# Patient Record
Sex: Female | Born: 2000 | Race: White | Hispanic: No | Marital: Single | Smoking: Former smoker
Health system: Southern US, Community
[De-identification: ages and names within clinical notes are randomized; demographics above are authoritative.]

## PROBLEM LIST (undated history)

## (undated) ENCOUNTER — Inpatient Hospital Stay: Payer: Self-pay

## (undated) DIAGNOSIS — Z87440 Personal history of urinary (tract) infections: Secondary | ICD-10-CM

## (undated) DIAGNOSIS — Z789 Other specified health status: Secondary | ICD-10-CM

## (undated) HISTORY — DX: Personal history of urinary (tract) infections: Z87.440

## (undated) HISTORY — DX: Other specified health status: Z78.9

## (undated) HISTORY — PX: NO PAST SURGERIES: SHX2092

---
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
## 2018-08-11 NOTE — L&D Delivery Note (Signed)
Delivery Summary for Diana Estrada  Labor Events:   Preterm labor:   Rupture date: 07/01/2019  Rupture time: 4:06 AM  Rupture type: Spontaneous Possible ROM - for evaluation  Fluid Color:   Induction:   Augmentation:   Complications:   Cervical ripening:          Delivery:   Episiotomy:   Lacerations:   Repair suture:   Repair # of packets:   Blood loss (ml): 150   Information for the patient's newborn:  Arleen, Bar [[id number]]    Delivery 07/01/2019 10:24 PM by  Vaginal, Spontaneous Sex:  female Gestational Age: [redacted]w[redacted]d Delivery Clinician:   Living?:         APGARS  One minute Five minutes Ten minutes  Skin color:        Heart rate:        Grimace:        Muscle tone:        Breathing:        Totals: 8  9      Presentation/position:      Resuscitation:   Cord information:    Disposition of cord blood:     Blood gases sent?  Complications:   Placenta: Delivered:       appearance Newborn Measurements: Weight: 8 lb 14.5 oz (4040 g)  Height: 21.26"  Head circumference:    Chest circumference:    Other providers:    Additional  information: Forceps:   Vacuum:   Breech:   Observed anomalies        Delivery Note At 10:24 PM a viable and healthy child was delivered via Vaginal, Spontaneous (Presentation: Vertex; ROA position).  APGAR: 8, 9; weight 4040 grams.   Placenta status: spontaneously removed, intact.  Cord: 3-vessel with the following complications: nuchal cord x 1, reduced after delivery of infant.  Cord pH: not obtained  Anesthesia:  Epidural Episiotomy: None Lacerations: None Suture Repair: None Est. Blood Loss (mL): 150  Mom to postpartum.  Baby to Couplet care / Skin to Skin.  Rubie Maid, MD 07/01/2019, 10:46 PM

## 2018-12-16 ENCOUNTER — Ambulatory Visit (INDEPENDENT_AMBULATORY_CARE_PROVIDER_SITE_OTHER): Payer: Self-pay

## 2018-12-16 ENCOUNTER — Other Ambulatory Visit (HOSPITAL_COMMUNITY)
Admission: RE | Admit: 2018-12-16 | Discharge: 2018-12-16 | Disposition: A | Discharge status: Home / Self Care | Payer: Medicaid Other | Source: Ambulatory Visit | Attending: Obstetrics and Gynecology | Admitting: Obstetrics and Gynecology

## 2018-12-16 ENCOUNTER — Encounter: Payer: Self-pay | Admitting: Obstetrics and Gynecology

## 2018-12-16 ENCOUNTER — Ambulatory Visit (INDEPENDENT_AMBULATORY_CARE_PROVIDER_SITE_OTHER): Payer: Self-pay | Admitting: Obstetrics and Gynecology

## 2018-12-16 ENCOUNTER — Other Ambulatory Visit: Payer: Self-pay

## 2018-12-16 VITALS — BP 115/65 | HR 82 | Ht 67.0 in | Wt 215.9 lb

## 2018-12-16 DIAGNOSIS — Z3490 Encounter for supervision of normal pregnancy, unspecified, unspecified trimester: Secondary | ICD-10-CM | POA: Insufficient documentation

## 2018-12-16 DIAGNOSIS — O26891 Other specified pregnancy related conditions, first trimester: Secondary | ICD-10-CM

## 2018-12-16 DIAGNOSIS — N898 Other specified noninflammatory disorders of vagina: Secondary | ICD-10-CM | POA: Insufficient documentation

## 2018-12-16 DIAGNOSIS — Z3481 Encounter for supervision of other normal pregnancy, first trimester: Secondary | ICD-10-CM

## 2018-12-16 DIAGNOSIS — Z3A12 12 weeks gestation of pregnancy: Secondary | ICD-10-CM

## 2018-12-16 DIAGNOSIS — O3481 Maternal care for other abnormalities of pelvic organs, first trimester: Secondary | ICD-10-CM

## 2018-12-16 DIAGNOSIS — Z113 Encounter for screening for infections with a predominantly sexual mode of transmission: Secondary | ICD-10-CM | POA: Insufficient documentation

## 2018-12-16 DIAGNOSIS — R638 Other symptoms and signs concerning food and fluid intake: Secondary | ICD-10-CM | POA: Insufficient documentation

## 2018-12-16 DIAGNOSIS — R110 Nausea: Secondary | ICD-10-CM

## 2018-12-16 DIAGNOSIS — Z3A11 11 weeks gestation of pregnancy: Secondary | ICD-10-CM

## 2018-12-16 DIAGNOSIS — O99211 Obesity complicating pregnancy, first trimester: Secondary | ICD-10-CM

## 2018-12-16 DIAGNOSIS — O99210 Obesity complicating pregnancy, unspecified trimester: Secondary | ICD-10-CM

## 2018-12-16 DIAGNOSIS — Z0283 Encounter for blood-alcohol and blood-drug test: Secondary | ICD-10-CM

## 2018-12-16 DIAGNOSIS — N8312 Corpus luteum cyst of left ovary: Secondary | ICD-10-CM

## 2018-12-16 LAB — POCT URINALYSIS DIPSTICK OB
Bilirubin, UA: NEGATIVE
Blood, UA: NEGATIVE
Glucose, UA: NEGATIVE
Ketones, UA: NEGATIVE
Leukocytes, UA: NEGATIVE
Nitrite, UA: NEGATIVE
Spec Grav, UA: 1.025 (ref 1.010–1.025)
Urobilinogen, UA: 0.2 E.U./dL
pH, UA: 6 (ref 5.0–8.0)

## 2018-12-16 LAB — OB RESULTS CONSOLE VARICELLA ZOSTER ANTIBODY, IGG: Varicella: IMMUNE

## 2018-12-16 NOTE — Progress Notes (Signed)
New OB-Pt is present today for new ob physical. Pt stated that this is her second pregnancy, pt has a 3 year girl. Pt stated that she is having sharp pains and pressure in the lower abd pt think it maybe gas.

## 2018-12-16 NOTE — Progress Notes (Signed)
OBSTETRIC INITIAL PRENATAL VISIT  Subjective:    Diana Estrada is being seen today for her first obstetrical visit.  This is not a planned pregnancy. She is a 18 y.o. G2P1000 female at 324w5d gestation, Estimated Date of Delivery: 07/02/19 with Patient's last menstrual period was 09/25/2018 (however lmp unsure). [facility]. Her obstetrical history is significant for obesity. Relationship with FOB: significant other, living together (recently engaged). Patient does intend to breast feed. Pregnancy history fully reviewed.    OB History  Gravida Para Term Preterm AB Living  2 1 1  0 0 0  SAB TAB Ectopic Multiple Live Births  0 0 0 0 0    # Outcome Date GA Lbr Len/2nd Weight Sex Delivery Anes PTL Lv  2 Current           1 Term 06/29/15 3275w0d 30:50 / 01:23 6 lb 15.5 oz (3.16 kg) F Vag-Spont EPI       Name: Diana Estrada     Apgar1: 4  Apgar5: 9     Gynecologic History:  Last pap smear was: patient has never had a pap smear.  Denies history of STIs.  Contraception: None.  Last Depo Provera injection was in December.   Past Medical History:  Diagnosis Date  . History of recurrent UTIs 07/30/2016    Family History  Problem Relation Age of Onset  . Healthy Mother     Past Surgical History:  Procedure Laterality Date  . NO PAST SURGERIES      Social History   Socioeconomic History  . Marital status: Single    Spouse name: Not on file  . Number of children: Not on file  . Years of education: Not on file  . Highest education level: Not on file  Occupational History  . Not on file  Social Needs  . Financial resource strain: Not on file  . Food insecurity:    Worry: Not on file    Inability: Not on file  . Transportation needs:    Medical: Not on file    Non-medical: Not on file  Tobacco Use  . Smoking status: Former Smoker    Last attempt to quit: 08/11/2014    Years since quitting: 4.3  . Smokeless tobacco:  Never Used  Substance and Sexual Activity  . Alcohol use: No  . Drug use: Not Currently    Types: Marijuana  . Sexual activity: Yes  Lifestyle  . Physical activity:    Days per week: Not on file    Minutes per session: Not on file  . Stress: Not on file  Relationships  . Social connections:    Talks on phone: Not on file    Gets together: Not on file    Attends religious service: Not on file    Active member of club or organization: Not on file    Attends meetings of clubs or organizations: Not on file    Relationship status: Not on file  . Intimate partner violence:    Fear of current or ex partner: Not on file    Emotionally abused: Not on file    Physically abused: Not on file    Forced sexual activity: Not on file  Other Topics Concern  . Not on file  Social History Narrative  . Not on file    Current Outpatient Medications on File Prior to Visit  Medication Sig Dispense Refill  . Prenatal Vit-Fe Fumarate-FA (PRENATAL MULTIVITAMIN) TABS  tablet Take 1 tablet by mouth daily at 12 noon.     No current facility-administered medications on file prior to visit.     No Known Allergies   Review of Systems General: Not Present- Fever, Weight Loss and Weight Gain. Skin: Not Present- Rash. HEENT: Not Present- Blurred Vision, Headache and Bleeding Gums. Respiratory: Not Present- Difficulty Breathing. Breast: Not Present- Breast Mass. Cardiovascular: Not Present- Chest Pain, Elevated Blood Pressure, Fainting / Blacking Out and Shortness of Breath. Gastrointestinal: Not Present- Abdominal Pain, Constipation. Present - Nausea without Vomiting. Female Genitourinary: Not Present- Frequency, Painful Urination, Pelvic Pain, Vaginal Bleeding, Vaginal Discharge, Contractions, regular, Fetal Movements Decreased, Urinary Complaints and Vaginal Fluid. Positive for vaginal odor x 1 week.  Musculoskeletal: Not Present- Back Pain and Leg Cramps. Neurological: Not Present- Dizziness.  Psychiatric: Not Present- Depression.     Objective:   Blood pressure 115/65, pulse 82, weight 215 lb 14.4 oz (97.9 kg), last menstrual period 09/25/2018.  Body mass index is 33.81 kg/m.   General Appearance:    Alert, cooperative, no distress, appears stated age  Head:    Normocephalic, without obvious abnormality, atraumatic  Eyes:    PERRL, conjunctiva/corneas clear, EOM's intact, both eyes  Ears:    Normal external ear canals, both ears  Nose:   Nares normal, septum midline, mucosa normal, no drainage or sinus tenderness  Throat:   Lips, mucosa, and tongue normal; teeth and gums normal  Neck:   Supple, symmetrical, trachea midline, no adenopathy; thyroid: no enlargement/tenderness/nodules; no carotid bruit or JVD  Back:     Symmetric, no curvature, ROM normal, no CVA tenderness  Lungs:     Clear to auscultation bilaterally, respirations unlabored  Chest Wall:    No tenderness or deformity   Heart:    Regular rate and rhythm, S1 and S2 normal, no murmur, rub or gallop  Breast Exam:    No tenderness, masses, or nipple abnormality  Abdomen:     Soft, non-tender, bowel sounds active all four quadrants, no masses, no organomegaly.  FH 12.  FHT 156 bpm.  Genitalia:    Pelvic:external genitalia normal, vagina without lesions, discharge, or tenderness, rectovaginal septum  normal. Cervix normal in appearance, no cervical motion tenderness, no adnexal masses or tenderness.  Pregnancy positive findings: uterine enlargement: 12 wk size, nontender.   Rectal:    Normal external sphincter.  No hemorrhoids appreciated. Internal exam not done.   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 Prenatal vitamins encouraged. Problem list reviewed and updated. New OB counseling:  The patient has been given an overview regarding routine prenatal care.  Recommendations regarding diet, weight gain, and exercise in pregnancy were given. Prenatal testing, optional genetic testing, and ultrasound use in pregnancy were reviewed.  Genetic testing: declined. Benefits of Breast Feeding were discussed. The patient is encouraged to consider nursing her baby post partum. Given samples of Bonjesta for nausea. Patient also encouraged on use of Vitamin B6, dry diet.  Nuswab performed for vaginal odor.  Follow up in 4-6 weeks.  The patient has Medicaid.  [place] Medicaid Risk Screening Form completed today.    50% of 30 min visit spent on counseling and coordination of care.      Hildred Laser, MD [facility]

## 2018-12-17 LAB — HEPATITIS B SURFACE ANTIGEN: Hepatitis B Surface Ag: NEGATIVE

## 2018-12-17 LAB — NICOTINE SCREEN, URINE: Cotinine Ql Scrn, Ur: POSITIVE ng/mL — AB

## 2018-12-17 LAB — HGB SOLU + RFLX FRAC: Sickle Solubility Test - HGBRFX: NEGATIVE

## 2018-12-17 LAB — URINALYSIS, ROUTINE W REFLEX MICROSCOPIC
Bilirubin, UA: NEGATIVE
Glucose, UA: NEGATIVE
Ketones, UA: NEGATIVE
Leukocytes,UA: NEGATIVE
Nitrite, UA: NEGATIVE
RBC, UA: NEGATIVE
Specific Gravity, UA: >=1.03 — AB (ref 1.005–1.030)
Urobilinogen, Ur: 0.2 mg/dL (ref 0.2–1.0)
pH, UA: 6 (ref 5.0–7.5)

## 2018-12-17 LAB — RUBELLA SCREEN: Rubella Antibodies, IGG: 1.41 index (ref >0.99)

## 2018-12-17 LAB — DRUG PROFILE, UR, 9 DRUGS (LABCORP)
Amphetamines, Urine: NEGATIVE ng/mL
Barbiturate Quant, Ur: NEGATIVE ng/mL
Benzodiazepine Quant, Ur: NEGATIVE ng/mL
Cannabinoid Quant, Ur: NEGATIVE ng/mL
Cocaine (Metab.): NEGATIVE ng/mL
Methadone Screen, Urine: NEGATIVE ng/mL
Opiate Quant, Ur: NEGATIVE ng/mL
PCP Quant, Ur: NEGATIVE ng/mL
Propoxyphene: NEGATIVE ng/mL

## 2018-12-17 LAB — HIV ANTIBODY (ROUTINE TESTING W REFLEX): HIV Screen 4th Generation wRfx: NONREACTIVE

## 2018-12-17 LAB — RPR: RPR Ser Ql: NONREACTIVE

## 2018-12-17 LAB — ABO AND RH: Rh Factor: POSITIVE

## 2018-12-17 LAB — TSH: TSH: 1.88 u[IU]/mL (ref 0.450–4.500)

## 2018-12-17 LAB — HEMOGLOBIN A1C
Est. average glucose Bld gHb Est-mCnc: 97 mg/dL
Hgb A1c MFr Bld: 5 % (ref 4.8–5.6)

## 2018-12-17 LAB — ANTIBODY SCREEN: Antibody Screen: NEGATIVE

## 2018-12-17 LAB — VARICELLA ZOSTER ANTIBODY, IGG: Varicella zoster IgG: 604 index (ref >165)

## 2018-12-18 NOTE — Patient Instructions (Signed)
First Trimester of Pregnancy  The first trimester of pregnancy is from week 1 until the end of week 13 (months 1 through 3). During this time, your baby will begin to develop inside you. At 6-8 weeks, the eyes and face are formed, and the heartbeat can be seen on ultrasound. At the end of 12 weeks, all the baby's organs are formed. Prenatal care is all the medical care you receive before the birth of your baby. Make sure you get good prenatal care and follow all of your doctor's instructions. Follow these instructions at home: Medicines  Take over-the-counter and prescription medicines only as told by your doctor. Some medicines are safe and some medicines are not safe during pregnancy.  Take a prenatal vitamin that contains at least 600 micrograms (mcg) of folic acid.  If you have trouble pooping (constipation), take medicine that will make your stool soft (stool softener) if your doctor approves. Eating and drinking   Eat regular, healthy meals.  Your doctor will tell you the amount of weight gain that is right for you.  Avoid raw meat and uncooked cheese.  If you feel sick to your stomach (nauseous) or throw up (vomit): ? Eat 4 or 5 small meals a day instead of 3 large meals. ? Try eating a few soda crackers. ? Drink liquids between meals instead of during meals.  To prevent constipation: ? Eat foods that are high in fiber, like fresh fruits and vegetables, whole grains, and beans. ? Drink enough fluids to keep your pee (urine) clear or pale yellow. Activity  Exercise only as told by your doctor. Stop exercising if you have cramps or pain in your lower belly (abdomen) or low back.  Do not exercise if it is too hot, too humid, or if you are in a place of great height (high altitude).  Try to avoid standing for long periods of time. Move your legs often if you must stand in one place for a long time.  Avoid heavy lifting.  Wear low-heeled shoes. Sit and stand up straight.   You can have sex unless your doctor tells you not to. Relieving pain and discomfort  Wear a good support bra if your breasts are sore.  Take warm water baths (sitz baths) to soothe pain or discomfort caused by hemorrhoids. Use hemorrhoid cream if your doctor says it is okay.  Rest with your legs raised if you have leg cramps or low back pain.  If you have puffy, bulging veins (varicose veins) in your legs: ? Wear support hose or compression stockings as told by your doctor. ? Raise (elevate) your feet for 15 minutes, 3-4 times a day. ? Limit salt in your food. Prenatal care  Schedule your prenatal visits by the twelfth week of pregnancy.  Write down your questions. Take them to your prenatal visits.  Keep all your prenatal visits as told by your doctor. This is important. Safety  Wear your seat belt at all times when driving.  Make a list of emergency phone numbers. The list should include numbers for family, friends, the hospital, and police and fire departments. General instructions  Ask your doctor for a referral to a local prenatal class. Begin classes no later than at the start of month 6 of your pregnancy.  Ask for help if you need counseling or if you need help with nutrition. Your doctor can give you advice or tell you where to go for help.  Do not use hot tubs, steam   rooms, or saunas.  Do not douche or use tampons or scented sanitary pads.  Do not cross your legs for long periods of time.  Avoid all herbs and alcohol. Avoid drugs that are not approved by your doctor.  Do not use any tobacco products, including cigarettes, chewing tobacco, and electronic cigarettes. If you need help quitting, ask your doctor. You may get counseling or other support to help you quit.  Avoid cat litter boxes and soil used by cats. These carry germs that can cause birth defects in the baby and can cause a loss of your baby (miscarriage) or stillbirth.  Visit your dentist. At home,  brush your teeth with a soft toothbrush. Be gentle when you floss. Contact a doctor if:  You are dizzy.  You have mild cramps or pressure in your lower belly.  You have a nagging pain in your belly area.  You continue to feel sick to your stomach, you throw up, or you have watery poop (diarrhea).  You have a bad smelling fluid coming from your vagina.  You have pain when you pee (urinate).  You have increased puffiness (swelling) in your face, hands, legs, or ankles. Get help right away if:  You have a fever.  You are leaking fluid from your vagina.  You have spotting or bleeding from your vagina.  You have very bad belly cramping or pain.  You gain or lose weight rapidly.  You throw up blood. It may look like coffee grounds.  You are around people who have German measles, fifth disease, or chickenpox.  You have a very bad headache.  You have shortness of breath.  You have any kind of trauma, such as from a fall or a car accident. Summary  The first trimester of pregnancy is from week 1 until the end of week 13 (months 1 through 3).  To take care of yourself and your unborn baby, you will need to eat healthy meals, take medicines only if your doctor tells you to do so, and do activities that are safe for you and your baby.  Keep all follow-up visits as told by your doctor. This is important as your doctor will have to ensure that your baby is healthy and growing well. This information is not intended to replace advice given to you by your health care provider. Make sure you discuss any questions you have with your health care provider. Document Released: 01/14/2008 Document Revised: 08/05/2016 Document Reviewed: 08/05/2016 Elsevier Interactive Patient Education  2019 Elsevier Inc.  

## 2018-12-19 LAB — CULTURE, OB URINE

## 2018-12-19 LAB — URINE CULTURE, OB REFLEX

## 2018-12-20 LAB — CERVICOVAGINAL ANCILLARY ONLY
Bacterial vaginitis: POSITIVE — AB
Candida vaginitis: NEGATIVE
Chlamydia: NEGATIVE
Neisseria Gonorrhea: NEGATIVE
Trichomonas: NEGATIVE

## 2018-12-30 ENCOUNTER — Other Ambulatory Visit: Payer: Self-pay

## 2018-12-30 MED ORDER — METRONIDAZOLE 500 MG PO TABS: 500.0000 mg | ORAL_TABLET | Freq: Two times a day (BID) | ORAL | 0 refills | Status: DC

## 2019-01-26 ENCOUNTER — Telehealth: Payer: Self-pay

## 2019-01-26 NOTE — Telephone Encounter (Signed)
Pt prescreened no symptoms pt has face mask.   Coronavirus (COVID-19) Are you at risk?  Are you at risk for the Coronavirus (COVID-19)?  To be considered HIGH RISK for Coronavirus (COVID-19), you have to meet the following criteria:  . Traveled to [place], [place], [place], [place] or [place]; or in the [place] to [place], [place], [place], or [place]; and have fever, cough, and shortness of breath within the last 2 weeks of travel OR . Been in close contact with a person diagnosed with COVID-19 within the last 2 weeks and have fever, cough, and shortness of breath . IF YOU DO NOT MEET THESE CRITERIA, YOU ARE CONSIDERED LOW RISK FOR COVID-19.  What to do if you are HIGH RISK for COVID-19?  . If you are having a medical emergency, call 911. . Seek medical care right away. Before you go to a doctor's office, urgent care or emergency department, call ahead and tell them about your recent travel, contact with someone diagnosed with COVID-19, and your symptoms. You should receive instructions from your physician's office regarding next steps of care.  . When you arrive at healthcare provider, tell the healthcare staff immediately you have returned from visiting [place], [place], [place], [place] or [place]; or traveled in the [place] to [place], [place], [place], or [place]; in the last two weeks or you have been in close contact with a person diagnosed with COVID-19 in the last 2 weeks.   . Tell the health care staff about your symptoms: fever, cough and shortness of breath. . After you have been seen by a medical provider, you will be either: o Tested for (COVID-19) and discharged home on quarantine except to seek medical care if symptoms worsen, and asked to  - Stay home and avoid contact with others until you get your results (4-5 days)  - Avoid travel on public transportation if possible (such as bus, train, or airplane) or o Sent to the Emergency Department by EMS for  evaluation, COVID-19 testing, and possible admission depending on your condition and test results.  What to do if you are LOW RISK for COVID-19?  Reduce your risk of any infection by using the same precautions used for avoiding the common cold or flu:  . Wash your hands often with soap and warm water for at least 20 seconds.  If soap and water are not readily available, use an alcohol-based hand sanitizer with at least 60% alcohol.  . If coughing or sneezing, cover your mouth and nose by coughing or sneezing into the elbow areas of your shirt or coat, into a tissue or into your sleeve (not your hands). . Avoid shaking hands with others and consider head nods or verbal greetings only. . Avoid touching your eyes, nose, or mouth with unwashed hands.  . Avoid close contact with people who are sick. . Avoid places or events with large numbers of people in one location, like concerts or sporting events. . Carefully consider travel plans you have or are making. . If you are planning any travel outside or inside the [place], visit the CDC's Travelers' Health webpage for the latest health notices. . If you have some symptoms but not all symptoms, continue to monitor at home and seek medical attention if your symptoms worsen. . If you are having a medical emergency, call 911.   ADDITIONAL HEALTHCARE OPTIONS FOR PATIENTS  [facility] / e-Visit: [email or web address]           [facility]: [phone]  [facility]: [phone]                   [facility]: [phone]  

## 2019-01-27 ENCOUNTER — Ambulatory Visit (INDEPENDENT_AMBULATORY_CARE_PROVIDER_SITE_OTHER): Payer: Medicaid Other | Admitting: Obstetrics and Gynecology

## 2019-01-27 ENCOUNTER — Encounter: Payer: Self-pay | Admitting: Obstetrics and Gynecology

## 2019-01-27 ENCOUNTER — Other Ambulatory Visit: Payer: Self-pay

## 2019-01-27 VITALS — BP 109/71 | HR 102 | Ht 67.0 in | Wt 221.2 lb

## 2019-01-27 DIAGNOSIS — Z3482 Encounter for supervision of other normal pregnancy, second trimester: Secondary | ICD-10-CM

## 2019-01-27 NOTE — Progress Notes (Signed)
ROB: Without complaint.  Desires AFP and maternity 21 today.  FAS next visit

## 2019-01-27 NOTE — Addendum Note (Signed)
Addended by: Durwin Glaze on: 01/27/2019 10:31 AM   Modules accepted: Orders

## 2019-01-27 NOTE — Progress Notes (Signed)
Patient comes in today for ROB> no concerns.

## 2019-01-30 LAB — AFP, SERUM, OPEN SPINA BIFIDA
AFP MoM: 1.12
AFP Value: 33 ng/mL
Gest. Age on Collection Date: 17 weeks
Maternal Age At EDD: 20.5 yr
OSBR Risk 1 IN: 8371
Test Results:: NEGATIVE
Weight: 221 [lb_av]

## 2019-02-03 ENCOUNTER — Telehealth: Payer: Self-pay | Admitting: Obstetrics and Gynecology

## 2019-02-03 LAB — MATERNIT21  PLUS CORE+ESS+SCA, BLOOD

## 2019-02-03 NOTE — Telephone Encounter (Signed)
Pt called no answer LM via voicemail to call the office to speak more about her concerns for calling the office.

## 2019-02-03 NOTE — Telephone Encounter (Signed)
The pt is requesting a call back for assistance with navigating her MyChart/finding gender results. Please advise.

## 2019-02-04 NOTE — Telephone Encounter (Signed)
JW spoke with pt and assisted her with finding the information that she was looking for.

## 2019-02-23 ENCOUNTER — Telehealth: Payer: Self-pay

## 2019-02-23 NOTE — Telephone Encounter (Signed)
Pt called no answer LMTRC for prescreening. 

## 2019-02-24 ENCOUNTER — Ambulatory Visit (INDEPENDENT_AMBULATORY_CARE_PROVIDER_SITE_OTHER): Payer: Self-pay

## 2019-02-24 ENCOUNTER — Encounter: Payer: Self-pay | Admitting: Obstetrics and Gynecology

## 2019-02-24 ENCOUNTER — Other Ambulatory Visit: Payer: Self-pay

## 2019-02-24 ENCOUNTER — Ambulatory Visit (INDEPENDENT_AMBULATORY_CARE_PROVIDER_SITE_OTHER): Payer: Self-pay | Admitting: Obstetrics and Gynecology

## 2019-02-24 VITALS — BP 115/73 | HR 77 | Wt 224.5 lb

## 2019-02-24 DIAGNOSIS — Z3482 Encounter for supervision of other normal pregnancy, second trimester: Secondary | ICD-10-CM

## 2019-02-24 DIAGNOSIS — Z363 Encounter for antenatal screening for malformations: Secondary | ICD-10-CM

## 2019-02-24 LAB — POCT URINALYSIS DIPSTICK OB
Blood, UA: NEGATIVE
Glucose, UA: NEGATIVE
Ketones, UA: NEGATIVE
Leukocytes, UA: NEGATIVE
Nitrite, UA: NEGATIVE
POC,PROTEIN,UA: NEGATIVE
Spec Grav, UA: 1.015 (ref 1.010–1.025)
Urobilinogen, UA: 0.2 E.U./dL
pH, UA: 6.5 (ref 5.0–8.0)

## 2019-02-24 NOTE — Progress Notes (Signed)
ROB: Patient complains of leg cramps, mostly at night.  Advised on increasing hydration, calcium/potassium intake, tablespoon of mustard, or warm milk before bed. Normal anatomy scan today. Debating on options for contraception. RTC in 4 weeks.

## 2019-02-24 NOTE — Progress Notes (Signed)
Subjective:    Diana Estrada is a 18 y.o. female being seen today for her obstetrical visit. She is at [redacted]w[redacted]d gestation. She has one female child, and she is pleased that her current pregnancy is female. Doesn't plan on having additional children. Her first birth was vaginal with epidural; she's hoping to have a natural birth this time, but is also considering epidural. She used formula with her first child and didn't try formula, but she is still thinking about what she wants to do this time. Plans for birth control after this pregnancy-- had Depo before, didn't like ADRs. Mirena uncomfortable during sex. Is open to OCP but is worried about forgetting to take. Works at [employer] in [employer], so minimal contact with people/covid. She has been having leg cramping at night, but no varicose veins.   Menstrual History: OB History    Gravida  2   Para  1   Term  1   Preterm      AB      Living  1     SAB      TAB      Ectopic      Multiple  0   Live Births  1           Patient's last menstrual period was 09/25/2018 (lmp unknown).      Review of Systems See HPI.   Objective:    LMP 09/25/2018 (LMP Unknown)  FHT: 138bpm  Uterine Size: Not measured     Musculoskeletal- no edema, no varicose veins  Assessment & Plan    [redacted]w[redacted]d gestation. US OB performed today (02/24/19)- [redacted]w[redacted]d Viable Singleton Intrauterine pregnancy, female.  Birth control counseling Pt has tried Depo and Mirena in the past. She would like to try a different method in the future. She will continue to think about options and let us know.   Leg cramps at night Rec increasing potassium and calcium. Discussed potassium and calcium rich foods. Rec trying Tums at night as needed, eating a spoonful of mustard, good hydration, etc. On exam, no evidence of varicose veins; she can try compression socks if she does experience this.    Follow up in 4 weeks or as needed.  Marin Roberts, PA-S 02/24/19

## 2019-02-24 NOTE — Progress Notes (Signed)
ROB-Pt is present for prenatal care and anatomy scan. Pt stated having leg cramps at night.

## 2019-03-24 ENCOUNTER — Encounter: Payer: Self-pay | Admitting: Obstetrics and Gynecology

## 2019-03-24 ENCOUNTER — Other Ambulatory Visit: Payer: Self-pay

## 2019-03-24 ENCOUNTER — Ambulatory Visit (INDEPENDENT_AMBULATORY_CARE_PROVIDER_SITE_OTHER): Payer: Medicaid Other | Admitting: Obstetrics and Gynecology

## 2019-03-24 VITALS — BP 107/68 | HR 77 | Ht 67.0 in | Wt 234.0 lb

## 2019-03-24 DIAGNOSIS — Z3482 Encounter for supervision of other normal pregnancy, second trimester: Secondary | ICD-10-CM

## 2019-03-24 LAB — POCT URINALYSIS DIPSTICK OB
Bilirubin, UA: NEGATIVE
Blood, UA: NEGATIVE
Glucose, UA: NEGATIVE
Ketones, UA: NEGATIVE
Leukocytes, UA: NEGATIVE
Nitrite, UA: NEGATIVE
POC,PROTEIN,UA: NEGATIVE
Spec Grav, UA: 1.01 (ref 1.010–1.025)
Urobilinogen, UA: 0.2 E.U./dL
pH, UA: 7 (ref 5.0–8.0)

## 2019-03-24 NOTE — Progress Notes (Signed)
ROB: Patient has no complaints.  Reports daily fetal movement.  Taking prenatal vitamins.  1 hour GCT next visit.

## 2019-03-24 NOTE — Addendum Note (Signed)
Addended by: Durwin Glaze on: 03/24/2019 01:43 PM   Modules accepted: Orders

## 2019-03-24 NOTE — Progress Notes (Signed)
Patient comes in today for ROB visit. She has no concerns today.  

## 2019-04-14 ENCOUNTER — Ambulatory Visit (INDEPENDENT_AMBULATORY_CARE_PROVIDER_SITE_OTHER): Payer: Medicaid Other | Admitting: Obstetrics and Gynecology

## 2019-04-14 ENCOUNTER — Other Ambulatory Visit: Payer: Medicaid Other

## 2019-04-14 ENCOUNTER — Encounter: Payer: Self-pay | Admitting: Obstetrics and Gynecology

## 2019-04-14 ENCOUNTER — Other Ambulatory Visit: Payer: Self-pay | Admitting: Surgical

## 2019-04-14 ENCOUNTER — Other Ambulatory Visit: Payer: Self-pay

## 2019-04-14 VITALS — BP 108/71 | HR 78 | Ht 67.0 in | Wt 235.7 lb

## 2019-04-14 DIAGNOSIS — Z3482 Encounter for supervision of other normal pregnancy, second trimester: Secondary | ICD-10-CM

## 2019-04-14 DIAGNOSIS — Z23 Encounter for immunization: Secondary | ICD-10-CM

## 2019-04-14 LAB — POCT URINALYSIS DIPSTICK OB
Bilirubin, UA: NEGATIVE
Blood, UA: NEGATIVE
Glucose, UA: NEGATIVE
Ketones, UA: NEGATIVE
Leukocytes, UA: NEGATIVE
Nitrite, UA: NEGATIVE
POC,PROTEIN,UA: NEGATIVE
Spec Grav, UA: 1.01 (ref 1.010–1.025)
Urobilinogen, UA: 0.2 E.U./dL
pH, UA: 7 (ref 5.0–8.0)

## 2019-04-14 NOTE — Addendum Note (Signed)
Addended by: Durwin Glaze on: 04/14/2019 01:49 PM   Modules accepted: Orders

## 2019-04-14 NOTE — Progress Notes (Signed)
Patient comes in today for ROB visit. No concerns.  

## 2019-04-14 NOTE — Progress Notes (Signed)
ROB:  No problems.  1hr GCT today.

## 2019-04-15 LAB — CBC
Hematocrit: 34.6 % (ref 34.0–46.6)
Hemoglobin: 11 g/dL — ABNORMAL LOW (ref 11.1–15.9)
MCH: 30.3 pg (ref 26.6–33.0)
MCHC: 31.8 g/dL (ref 31.5–35.7)
MCV: 95 fL (ref 79–97)
Platelets: 182 10*3/uL (ref 150–450)
RBC: 3.63 x10E6/uL — ABNORMAL LOW (ref 3.77–5.28)
RDW: 12.9 % (ref 11.7–15.4)
WBC: 7.8 10*3/uL (ref 3.4–10.8)

## 2019-04-15 LAB — GLUCOSE, 1 HOUR GESTATIONAL: Gestational Diabetes Screen: 124 mg/dL (ref 65–139)

## 2019-04-15 LAB — RPR: RPR Ser Ql: NONREACTIVE

## 2019-05-13 ENCOUNTER — Encounter: Payer: Self-pay | Admitting: Obstetrics and Gynecology

## 2019-05-13 ENCOUNTER — Other Ambulatory Visit: Payer: Self-pay

## 2019-05-13 ENCOUNTER — Ambulatory Visit (INDEPENDENT_AMBULATORY_CARE_PROVIDER_SITE_OTHER): Payer: Medicaid Other | Admitting: Obstetrics and Gynecology

## 2019-05-13 VITALS — BP 113/70 | HR 93 | Ht 67.0 in | Wt 243.4 lb

## 2019-05-13 DIAGNOSIS — Z3A32 32 weeks gestation of pregnancy: Secondary | ICD-10-CM

## 2019-05-13 DIAGNOSIS — Z3482 Encounter for supervision of other normal pregnancy, second trimester: Secondary | ICD-10-CM

## 2019-05-13 DIAGNOSIS — R12 Heartburn: Secondary | ICD-10-CM

## 2019-05-13 DIAGNOSIS — O2603 Excessive weight gain in pregnancy, third trimester: Secondary | ICD-10-CM

## 2019-05-13 DIAGNOSIS — R109 Unspecified abdominal pain: Secondary | ICD-10-CM

## 2019-05-13 DIAGNOSIS — O26893 Other specified pregnancy related conditions, third trimester: Secondary | ICD-10-CM

## 2019-05-13 HISTORY — DX: Excessive weight gain in pregnancy, third trimester: O26.03

## 2019-05-13 LAB — POCT URINALYSIS DIPSTICK OB
Bilirubin, UA: NEGATIVE
Blood, UA: NEGATIVE
Glucose, UA: NEGATIVE
Ketones, UA: NEGATIVE
Leukocytes, UA: NEGATIVE
Nitrite, UA: NEGATIVE
Spec Grav, UA: 1.01 (ref 1.010–1.025)
Urobilinogen, UA: 0.2 E.U./dL
pH, UA: 7 (ref 5.0–8.0)

## 2019-05-13 MED ORDER — PANTOPRAZOLE SODIUM 20 MG PO TBEC: 20.0000 mg | DELAYED_RELEASE_TABLET | Freq: Every day | ORAL | 2 refills | Status: DC

## 2019-05-13 NOTE — Patient Instructions (Addendum)
Abdominal Pain During Pregnancy  Abdominal pain is common during pregnancy, and has many possible causes. Some causes are more serious than others, and sometimes the cause is not known. Abdominal pain can be a sign that labor is starting. It can also be caused by normal growth and stretching of muscles and ligaments during pregnancy. Always tell your health care provider if you have any abdominal pain. Follow these instructions at home:  Do not have sex or put anything in your vagina until your pain goes away completely.  Get plenty of rest until your pain improves.  Drink enough fluid to keep your urine pale yellow.  Take over-the-counter and prescription medicines only as told by your health care provider.  Keep all follow-up visits as told by your health care provider. This is important. Contact a health care provider if:  Your pain continues or gets worse after resting.  You have lower abdominal pain that: ? Comes and goes at regular intervals. ? Spreads to your back. ? Is similar to menstrual cramps.  You have pain or burning when you urinate. Get help right away if:  You have a fever or chills.  You have vaginal bleeding.  You are leaking fluid from your vagina.  You are passing tissue from your vagina.  You have vomiting or diarrhea that lasts for more than 24 hours.  Your baby is moving less than usual.  You feel very weak or faint.  You have shortness of breath.  You develop severe pain in your upper abdomen. Summary  Abdominal pain is common during pregnancy, and has many possible causes.  If you experience abdominal pain during pregnancy, tell your health care provider right away.  Follow your health care provider's home care instructions and keep all follow-up visits as directed. This information is not intended to replace advice given to you by your health care provider. Make sure you discuss any questions you have with your health care provider.  Document Released: 07/28/2005 Document Revised: 11/15/2018 Document Reviewed: 10/30/2016 Elsevier Patient Education  2020 Elsevier Inc.     Healthy Edison InternationalWeight Gain During Pregnancy, Adult A certain amount of weight gain during pregnancy is normal and healthy. How much weight you should gain depends on your overall health and a measurement called BMI (body mass index). BMI is an estimate of your body fat based on your height and weight. You can use an Software engineeronline calculator to figure out your BMI, or you can ask your health care provider to calculate it for you at your next visit. Your recommended pregnancy weight gain is based on your pre-pregnancy BMI. General guidelines for a healthy total weight gain during pregnancy are listed below. If your BMI at or before the start of your pregnancy is:  Less than 18.5 (underweight), you should gain 28-40 lb (13-18 kg).  18.5-24.9 (normal weight), you should gain 25-35 lb (11-16 kg).  25-29.9 (overweight), you should gain 15-25 lb (7-11 kg).  30 or higher (obese), you should gain 11-20 lb (5-9 kg). These ranges vary depending on your individual health. If you are carrying more than one baby (multiples), it may be safe to gain more weight than these recommendations. If you gain less weight than recommended, that may be safe as long as your baby is growing and developing normally. How can unhealthy weight gain affect me and my baby? Gaining too much weight during pregnancy can lead to pregnancy complications, such as:  A temporary form of diabetes that develops during pregnancy (gestational  diabetes).  High blood pressure during pregnancy and protein in your urine (preeclampsia).  High blood pressure during pregnancy without protein in your urine (gestational hypertension).  Your baby having a high weight at birth, which may: ? Raise your risk of having a more difficult delivery or a surgical delivery (cesarean delivery, or C-section). ? Raise your child's  risk of developing obesity during childhood. Not gaining enough weight can be life-threatening for your baby, and it may raise your baby's chances of:  Being born early (preterm).  Growing more slowly than normal during pregnancy (growth restriction).  Having a low weight at birth. What actions can I take to gain a healthy amount of weight during pregnancy? General instructions  Keep track of your weight gain during pregnancy.  Take over-the-counter and prescription medicines only as told by your health care provider. Take all prenatal supplements as directed.  Keep all health care visits during pregnancy (prenatal visits). These visits are a good time to discuss your weight gain. Your health care provider will weigh you at each visit to make sure you are gaining a healthy amount of weight. Nutrition   Eat a balanced, nutrient-rich diet. Eat plenty of: ? Fruits and vegetables, such as berries and broccoli. ? Whole grains, such as millet, barley, whole-wheat breads and cereals, and oatmeal. ? Low-fat dairy products or non-dairy products such as almond milk or rice milk. ? Protein foods, such as lean meat, chicken, eggs, and legumes (such as peas, beans, soybeans, and lentils).  Avoid foods that are fried or have a lot of fat, salt (sodium), or sugar.  Drink enough fluid to keep your urine pale yellow.  Choose healthy snack and drink options when you are at work or on the go: ? Drink water. Avoid soda, sports drinks, and juices that have added sugar. ? Avoid drinks with caffeine, such as coffee and energy drinks. ? Eat snacks that are high in protein, such as nuts, protein bars, and low-fat yogurt. ? Carry convenient snacks in your purse that do not need refrigeration, such as a pack of trail mix, an apple, or a granola bar.  If you need help improving your diet, work with a health care provider or a diet and nutrition specialist (dietitian). Activity   Exercise regularly, as  told by your health care provider. ? If you were active before becoming pregnant, you may be able to continue your regular fitness activities. ? If you were not active before pregnancy, you may gradually build up to exercising for 30 or more minutes on most days of the week. This may include walking, swimming, or yoga.  Ask your health care provider what activities are safe for you. Talk with your health care provider about whether you may need to be excused from certain school or work activities. Where to find more information Learn more about managing your weight gain during pregnancy from:  American Pregnancy Association: www.americanpregnancy.org  U.[place]. Department of Agriculture pregnancy weight gain calculator: https://ball-collins.biz/ Summary  Too much weight gain during pregnancy can lead to complications for you and your baby.  Find out your pre-pregnancy BMI to determine how much weight gain is healthy for you.  Eat nutritious foods and stay active.  Keep all of your prenatal visits as told by your health care provider. This information is not intended to replace advice given to you by your health care provider. Make sure you discuss any questions you have with your health care provider. Document Released: 04/17/2017 Document Revised:  04/17/2017 Document Reviewed: 04/17/2017 Elsevier Patient Education  [place].

## 2019-05-13 NOTE — Progress Notes (Signed)
ROB: Patient noted right sided pain and pelvic pain several days ago that lasted all night.  Notes it has resolved. Encouraged hydration, Tylenol as needed if it occusr again. Discussed weight gain in pregnancy, currently 43 lbs. Discussed healthy dietary habits, no further need to gain weight. Encouraged exercise in pregnancy.Can refer to nutritionist if desired.  Is also noting heartburn, already taking Tums. Will prescribe Protonix. RTC in 2 weeks.

## 2019-05-13 NOTE — Progress Notes (Signed)
Patient comes in today for [place] visit. She is having some at night that starts on the side.

## 2019-05-31 ENCOUNTER — Encounter: Payer: Self-pay | Admitting: Obstetrics and Gynecology

## 2019-05-31 ENCOUNTER — Other Ambulatory Visit: Payer: Self-pay

## 2019-05-31 ENCOUNTER — Ambulatory Visit (INDEPENDENT_AMBULATORY_CARE_PROVIDER_SITE_OTHER): Payer: Medicaid Other | Admitting: Obstetrics and Gynecology

## 2019-05-31 VITALS — BP 114/77 | HR 83 | Wt 241.3 lb

## 2019-05-31 DIAGNOSIS — Z3A34 34 weeks gestation of pregnancy: Secondary | ICD-10-CM

## 2019-05-31 DIAGNOSIS — Z3482 Encounter for supervision of other normal pregnancy, second trimester: Secondary | ICD-10-CM

## 2019-05-31 LAB — POCT URINALYSIS DIPSTICK OB
Bilirubin, UA: NEGATIVE
Blood, UA: NEGATIVE
Glucose, UA: NEGATIVE
Ketones, UA: NEGATIVE
Leukocytes, UA: NEGATIVE
Nitrite, UA: NEGATIVE
Spec Grav, UA: 1.01 (ref 1.010–1.025)
Urobilinogen, UA: 0.2 E.U./dL
pH, UA: 7 (ref 5.0–8.0)

## 2019-05-31 LAB — OB RESULTS CONSOLE GC/CHLAMYDIA: Gonorrhea: NEGATIVE

## 2019-05-31 NOTE — Progress Notes (Signed)
Patient comes in today for [place] visit. She is due for swabs today.

## 2019-05-31 NOTE — Progress Notes (Signed)
ROB: No complaints.  GBS-GC/CT performed. 

## 2019-06-01 LAB — GC/CHLAMYDIA PROBE AMP
Chlamydia trachomatis, NAA: NEGATIVE
Neisseria Gonorrhoeae by PCR: NEGATIVE

## 2019-06-02 LAB — STREP GP B NAA: Strep Gp B NAA: NEGATIVE

## 2019-06-10 ENCOUNTER — Encounter: Payer: Self-pay | Admitting: Obstetrics and Gynecology

## 2019-06-10 ENCOUNTER — Other Ambulatory Visit: Payer: Self-pay

## 2019-06-10 ENCOUNTER — Ambulatory Visit (INDEPENDENT_AMBULATORY_CARE_PROVIDER_SITE_OTHER): Payer: Medicaid Other | Admitting: Obstetrics and Gynecology

## 2019-06-10 VITALS — BP 120/73 | HR 91 | Wt 243.0 lb

## 2019-06-10 DIAGNOSIS — N898 Other specified noninflammatory disorders of vagina: Secondary | ICD-10-CM

## 2019-06-10 DIAGNOSIS — Z3A37 37 weeks gestation of pregnancy: Secondary | ICD-10-CM

## 2019-06-10 DIAGNOSIS — Z3483 Encounter for supervision of other normal pregnancy, third trimester: Secondary | ICD-10-CM

## 2019-06-10 DIAGNOSIS — O26893 Other specified pregnancy related conditions, third trimester: Secondary | ICD-10-CM

## 2019-06-10 DIAGNOSIS — G5600 Carpal tunnel syndrome, unspecified upper limb: Secondary | ICD-10-CM

## 2019-06-10 LAB — POCT URINALYSIS DIPSTICK OB
Bilirubin, UA: NEGATIVE
Blood, UA: NEGATIVE
Glucose, UA: NEGATIVE
Ketones, UA: NEGATIVE
Leukocytes, UA: NEGATIVE
Nitrite, UA: NEGATIVE
Spec Grav, UA: 1.015 (ref 1.010–1.025)
Urobilinogen, UA: 0.2 E.U./dL
pH, UA: 7.5 (ref 5.0–8.0)

## 2019-06-10 NOTE — Patient Instructions (Signed)

## 2019-06-10 NOTE — Progress Notes (Signed)
ROB-Pt present for routine prenatal care. Pt stated having pelvic pain and vaginal pressure. Pt declined flu vaccine.

## 2019-06-10 NOTE — Progress Notes (Signed)
ROB: Patient notes external vaginal itching, does note occasional discharge, thin. Advised on T-tree oil or coconut oil. Also c/o hand swelling and numbness.  Discussed use of hand braces. Vaginal cultures negative. Still unsure about contraception but will likely go back to Mirena IUD.  RTC in 1 week.

## 2019-06-17 ENCOUNTER — Encounter: Payer: Self-pay | Admitting: Obstetrics and Gynecology

## 2019-06-17 ENCOUNTER — Other Ambulatory Visit: Payer: Self-pay

## 2019-06-17 ENCOUNTER — Ambulatory Visit (INDEPENDENT_AMBULATORY_CARE_PROVIDER_SITE_OTHER): Payer: Medicaid Other | Admitting: Obstetrics and Gynecology

## 2019-06-17 VITALS — BP 124/73 | HR 101 | Ht 67.0 in | Wt 247.5 lb

## 2019-06-17 DIAGNOSIS — Z3A38 38 weeks gestation of pregnancy: Secondary | ICD-10-CM

## 2019-06-17 DIAGNOSIS — Z3483 Encounter for supervision of other normal pregnancy, third trimester: Secondary | ICD-10-CM

## 2019-06-17 LAB — POCT URINALYSIS DIPSTICK OB
Bilirubin, UA: NEGATIVE
Blood, UA: NEGATIVE
Glucose, UA: NEGATIVE
Ketones, UA: NEGATIVE
Leukocytes, UA: NEGATIVE
Nitrite, UA: NEGATIVE
Spec Grav, UA: 1.01 (ref 1.010–1.025)
Urobilinogen, UA: 0.2 E.U./dL
pH, UA: 7 (ref 5.0–8.0)

## 2019-06-17 NOTE — Progress Notes (Signed)
ROB: Irregular contractions.  Signs and symptoms of labor discussed.

## 2019-06-17 NOTE — Progress Notes (Signed)
Patient comes in today for  ROB visit.  

## 2019-06-24 ENCOUNTER — Encounter: Payer: Self-pay | Admitting: Obstetrics and Gynecology

## 2019-06-24 ENCOUNTER — Other Ambulatory Visit: Payer: Self-pay

## 2019-06-24 ENCOUNTER — Ambulatory Visit (INDEPENDENT_AMBULATORY_CARE_PROVIDER_SITE_OTHER): Payer: Medicaid Other | Admitting: Obstetrics and Gynecology

## 2019-06-24 VITALS — BP 116/75 | HR 86 | Wt 246.5 lb

## 2019-06-24 DIAGNOSIS — Z3A39 39 weeks gestation of pregnancy: Secondary | ICD-10-CM

## 2019-06-24 DIAGNOSIS — R102 Pelvic and perineal pain: Secondary | ICD-10-CM

## 2019-06-24 DIAGNOSIS — Z3483 Encounter for supervision of other normal pregnancy, third trimester: Secondary | ICD-10-CM

## 2019-06-24 DIAGNOSIS — O26893 Other specified pregnancy related conditions, third trimester: Secondary | ICD-10-CM

## 2019-06-24 DIAGNOSIS — O26899 Other specified pregnancy related conditions, unspecified trimester: Secondary | ICD-10-CM

## 2019-06-24 LAB — POCT URINALYSIS DIPSTICK OB
Bilirubin, UA: NEGATIVE
Blood, UA: NEGATIVE
Glucose, UA: NEGATIVE
Ketones, UA: NEGATIVE
Leukocytes, UA: NEGATIVE
Nitrite, UA: NEGATIVE
Spec Grav, UA: 1.015 (ref 1.010–1.025)
Urobilinogen, UA: 0.2 E.U./dL
pH, UA: 7 (ref 5.0–8.0)

## 2019-06-24 MED ORDER — DOCUSATE SODIUM 100 MG PO CAPS: 100.0000 mg | ORAL_CAPSULE | Freq: Two times a day (BID) | ORAL | 2 refills | Status: DC | PRN

## 2019-06-24 NOTE — Progress Notes (Signed)
ROB: Patient notes sharp shooting pains in vagina and more pelvic pressure. Also noting constipation. Given prescription for Colace, can also use apple juice/prune juice. Discussed COVID visitor policy.  Plans to bottle feed.

## 2019-06-24 NOTE — Progress Notes (Signed)
ROB-Pt present for routine prenatal care. Pt stated that she was doing well, but having some lower pelvic pain no other concerns.

## 2019-06-30 ENCOUNTER — Encounter: Payer: Self-pay | Admitting: Obstetrics and Gynecology

## 2019-06-30 ENCOUNTER — Ambulatory Visit (INDEPENDENT_AMBULATORY_CARE_PROVIDER_SITE_OTHER): Payer: Medicaid Other | Admitting: Obstetrics and Gynecology

## 2019-06-30 ENCOUNTER — Other Ambulatory Visit: Payer: Self-pay

## 2019-06-30 VITALS — BP 135/81 | HR 86 | Wt 251.1 lb

## 2019-06-30 DIAGNOSIS — Z3A39 39 weeks gestation of pregnancy: Secondary | ICD-10-CM

## 2019-06-30 DIAGNOSIS — Z3483 Encounter for supervision of other normal pregnancy, third trimester: Secondary | ICD-10-CM

## 2019-06-30 LAB — POCT URINALYSIS DIPSTICK OB
Bilirubin, UA: NEGATIVE
Blood, UA: NEGATIVE
Glucose, UA: NEGATIVE
Ketones, UA: NEGATIVE
Leukocytes, UA: NEGATIVE
Nitrite, UA: NEGATIVE
Spec Grav, UA: 1.01 (ref 1.010–1.025)
Urobilinogen, UA: 0.2 E.U./dL
pH, UA: 6.5 (ref 5.0–8.0)

## 2019-06-30 NOTE — Progress Notes (Signed)
ROB: Signs and symptoms of labor discussed.  Patient says she is not having contractions.  Induction discussed and scheduled.  Covid testing discussed.

## 2019-07-01 ENCOUNTER — Inpatient Hospital Stay: Payer: Medicaid Other | Admitting: Certified Registered"

## 2019-07-01 ENCOUNTER — Other Ambulatory Visit: Payer: Self-pay

## 2019-07-01 ENCOUNTER — Inpatient Hospital Stay
Admission: EM | Admit: 2019-07-01 | Discharge: 2019-07-03 | DRG: 807 | Disposition: A | Discharge status: Home / Self Care | Payer: Medicaid Other | Attending: Obstetrics and Gynecology | Admitting: Obstetrics and Gynecology

## 2019-07-01 DIAGNOSIS — O4202 Full-term premature rupture of membranes, onset of labor within 24 hours of rupture: Secondary | ICD-10-CM

## 2019-07-01 DIAGNOSIS — O4292 Full-term premature rupture of membranes, unspecified as to length of time between rupture and onset of labor: Secondary | ICD-10-CM | POA: Diagnosis present

## 2019-07-01 DIAGNOSIS — O480 Post-term pregnancy: Principal | ICD-10-CM | POA: Diagnosis present

## 2019-07-01 DIAGNOSIS — Z3A39 39 weeks gestation of pregnancy: Secondary | ICD-10-CM | POA: Diagnosis not present

## 2019-07-01 DIAGNOSIS — O2603 Excessive weight gain in pregnancy, third trimester: Secondary | ICD-10-CM

## 2019-07-01 DIAGNOSIS — O4290 Premature rupture of membranes, unspecified as to length of time between rupture and onset of labor, unspecified weeks of gestation: Secondary | ICD-10-CM

## 2019-07-01 DIAGNOSIS — O99214 Obesity complicating childbirth: Secondary | ICD-10-CM

## 2019-07-01 DIAGNOSIS — Z87891 Personal history of nicotine dependence: Secondary | ICD-10-CM

## 2019-07-01 DIAGNOSIS — Z20828 Contact with and (suspected) exposure to other viral communicable diseases: Secondary | ICD-10-CM | POA: Diagnosis present

## 2019-07-01 LAB — CBC
HCT: 34.4 % — ABNORMAL LOW (ref 36.0–46.0)
Hemoglobin: 11.2 g/dL — ABNORMAL LOW (ref 12.0–15.0)
MCH: 30 pg (ref 26.0–34.0)
MCHC: 32.6 g/dL (ref 30.0–36.0)
MCV: 92.2 fL (ref 80.0–100.0)
Platelets: 151 10*3/uL (ref 150–400)
RBC: 3.73 MIL/uL — ABNORMAL LOW (ref 3.87–5.11)
RDW: 13.8 % (ref 11.5–15.5)
WBC: 8.4 10*3/uL (ref 4.0–10.5)
nRBC: 0 % (ref 0.0–0.2)

## 2019-07-01 LAB — TYPE AND SCREEN
ABO/RH(D): A POS
Antibody Screen: NEGATIVE

## 2019-07-01 LAB — RPR: RPR Ser Ql: NONREACTIVE

## 2019-07-01 LAB — RUPTURE OF MEMBRANE (ROM)PLUS: Rom Plus: POSITIVE

## 2019-07-01 LAB — SARS CORONAVIRUS 2 BY RT PCR (HOSPITAL ORDER, PERFORMED IN ~~LOC~~ HOSPITAL LAB): SARS Coronavirus 2: NEGATIVE

## 2019-07-01 MED ORDER — OXYTOCIN 10 UNIT/ML IJ SOLN
INTRAMUSCULAR | Status: AC
  Filled 2019-07-01: qty 2

## 2019-07-01 MED ORDER — LIDOCAINE HCL (PF) 1 % IJ SOLN: 30.0000 mL | INTRAMUSCULAR | Status: DC | PRN

## 2019-07-01 MED ORDER — MISOPROSTOL 200 MCG PO TABS
ORAL_TABLET | ORAL | Status: AC
  Filled 2019-07-01: qty 4

## 2019-07-01 MED ORDER — FENTANYL 2.5 MCG/ML W/ROPIVACAINE 0.15% IN NS 100 ML EPIDURAL (ARMC)
EPIDURAL | Status: AC
  Administered 2019-07-01: 250 ug
  Filled 2019-07-01: qty 100

## 2019-07-01 MED ORDER — FENTANYL 2.5 MCG/ML W/ROPIVACAINE 0.15% IN NS 100 ML EPIDURAL (ARMC)
EPIDURAL | Status: DC | PRN
  Administered 2019-07-01: 12 mL/h via EPIDURAL

## 2019-07-01 MED ORDER — BUPIVACAINE HCL (PF) 0.25 % IJ SOLN
INTRAMUSCULAR | Status: DC | PRN
  Administered 2019-07-01: 2 mL via EPIDURAL
  Administered 2019-07-01: 4 mL via EPIDURAL

## 2019-07-01 MED ORDER — LIDOCAINE HCL (PF) 1 % IJ SOLN
INTRAMUSCULAR | Status: DC | PRN
  Administered 2019-07-01: 3 mL

## 2019-07-01 MED ORDER — FENTANYL 2.5 MCG/ML W/ROPIVACAINE 0.15% IN NS 100 ML EPIDURAL (ARMC)
EPIDURAL | Status: AC
  Filled 2019-07-01: qty 100

## 2019-07-01 MED ORDER — OXYTOCIN 40 UNITS IN NORMAL SALINE INFUSION - SIMPLE MED: 2.5000 [IU]/h | INTRAVENOUS | Status: DC

## 2019-07-01 MED ORDER — OXYTOCIN 40 UNITS IN NORMAL SALINE INFUSION - SIMPLE MED
1.0000 m[IU]/min | INTRAVENOUS | Status: DC
  Administered 2019-07-01: 16:00:00 2 m[IU]/min via INTRAVENOUS
  Filled 2019-07-01: qty 1000

## 2019-07-01 MED ORDER — ACETAMINOPHEN 325 MG PO TABS: 650.0000 mg | ORAL_TABLET | ORAL | Status: DC | PRN

## 2019-07-01 MED ORDER — LACTATED RINGERS IV SOLN
500.0000 mL | INTRAVENOUS | Status: DC | PRN
  Administered 2019-07-01: 16:00:00 250 mL via INTRAVENOUS
  Administered 2019-07-01: 13:00:00 500 mL via INTRAVENOUS
  Administered 2019-07-01: 16:00:00 250 mL via INTRAVENOUS

## 2019-07-01 MED ORDER — OXYTOCIN BOLUS FROM INFUSION
500.0000 mL | Freq: Once | INTRAVENOUS | Status: AC
  Administered 2019-07-01: 23:00:00 500 mL via INTRAVENOUS

## 2019-07-01 MED ORDER — SOD CITRATE-CITRIC ACID 500-334 MG/5ML PO SOLN: 30.0000 mL | ORAL | Status: DC | PRN

## 2019-07-01 MED ORDER — LIDOCAINE-EPINEPHRINE (PF) 1.5 %-1:200000 IJ SOLN
INTRAMUSCULAR | Status: DC | PRN
  Administered 2019-07-01: 3 mL via PERINEURAL
  Administered 2019-07-01: 2 mL via PERINEURAL

## 2019-07-01 MED ORDER — LIDOCAINE HCL (PF) 1 % IJ SOLN
INTRAMUSCULAR | Status: AC
  Filled 2019-07-01: qty 30

## 2019-07-01 MED ORDER — ONDANSETRON HCL 4 MG/2ML IJ SOLN: 4.0000 mg | Freq: Four times a day (QID) | INTRAMUSCULAR | Status: DC | PRN

## 2019-07-01 MED ORDER — TERBUTALINE SULFATE 1 MG/ML IJ SOLN: 0.2500 mg | Freq: Once | INTRAMUSCULAR | Status: DC | PRN

## 2019-07-01 MED ORDER — AMMONIA AROMATIC IN INHA
RESPIRATORY_TRACT | Status: AC
  Filled 2019-07-01: qty 10

## 2019-07-01 MED ORDER — LACTATED RINGERS IV SOLN
INTRAVENOUS | Status: DC
  Administered 2019-07-01 (×4): via INTRAVENOUS

## 2019-07-01 MED ORDER — BUTORPHANOL TARTRATE 1 MG/ML IJ SOLN
1.0000 mg | INTRAMUSCULAR | Status: DC | PRN
  Administered 2019-07-01: 09:00:00 1 mg via INTRAVENOUS
  Filled 2019-07-01: qty 1

## 2019-07-01 NOTE — Progress Notes (Signed)
Intrapartum Progress Note  S: Patient sitting up to get epidural. Notes pain 10/10 with contractions  O: Blood pressure 136/68, pulse 76, temperature 98 F (36.7 C), temperature source Oral, resp. rate 17, height 5\' 8"  (1.727 m), weight 113.9 kg, last menstrual period 09/25/2018. Gen App: NAD Abdomen: soft, gravid FHT: baseline 130 bpm.  Accels present.  Decels absent. moderate in degree variability.   Tocometer: contractions q 2-6 minutes Cervix: 6/80/-1 Extremities: Nontender, no edema.  Pitocin: None  Labs:  No new labs  Assessment:  1: SIUP at [redacted]w[redacted]d 2. PROM, GBS neg  Plan:  1. Continue expectant management. Pitocin for augmentation if needed.  2. Receiving epidural for analgesia.    Rubie Maid, MD 07/01/2019 1:15 PM

## 2019-07-01 NOTE — H&P (Signed)
Obstetric History and Physical  Diana Estrada is a 18 y.o. G2P1001 with IUP at [redacted]w[redacted]d presenting for complaints of LOF since 0400 this morning. Patient states she has been having  irregular, every 4-5 minutes contractions since shortly after rupture of membranes, no vaginal bleeding, ruptured, clear fluid membranes, with active fetal movement.    Prenatal Course Source of Care: [facility] with onset of care at 11 weeks Pregnancy complications or risks: Patient Active Problem List   Diagnosis Date Noted  . Post-dates pregnancy 07/01/2019  . Heartburn in pregnancy in third trimester 05/13/2019  . Excess weight gain in pregnancy, third trimester 05/13/2019  . Supervision of normal pregnancy 12/16/2018  . Increased body mass index (BMI) 12/16/2018  . History of recurrent UTIs 07/30/2016  . Eating disorder, unspecified 08/29/2013  . Cannabis abuse 08/29/2013  . MDD (major depressive disorder), recurrent severe, without psychosis ([place]) 08/27/2013  . Social anxiety disorder 08/27/2013   She plans to breast and bottle feed She desires Mirena IUD for postpartum contraception.   Prenatal labs and studies: ABO, Rh: --/--/A POS (11/20 0630) Antibody: NEG (11/20 0630) Rubella: 1.41 (05/07 1017) RPR: Non Reactive (09/03 1020)  HBsAg: Negative (05/07 1017)  HIV: Non Reactive (05/07 1017)  GBS:--/Negative (10/20 1536) 1 hr Glucola  Normal (124) Genetic screening normal Anatomy US normal  Past Medical History:  Diagnosis Date  . History of recurrent UTIs 07/30/2016  . Medical history non-contributory     Past Surgical History:  Procedure Laterality Date  . NO PAST SURGERIES      OB History  Gravida Para Term Preterm AB Living  2 1 1     1   SAB TAB Ectopic Multiple Live Births        0 1    # Outcome Date GA Lbr Len/2nd Weight Sex Delivery Anes PTL Lv  2 Current           1 Term 06/29/15 [redacted]w[redacted]d 30:50 / 01:23 3160 g F Vag-Spont EPI  LIV    Social History    Socioeconomic History  . Marital status: Single    Spouse name: Not on file  . Number of children: Not on file  . Years of education: Not on file  . Highest education level: Not on file  Occupational History  . Not on file  Social Needs  . Financial resource strain: Not on file  . Food insecurity    Worry: Not on file    Inability: Not on file  . Transportation needs    Medical: Not on file    Non-medical: Not on file  Tobacco Use  . Smoking status: Former Smoker    Quit date: 08/11/2014    Years since quitting: 4.8  . Smokeless tobacco: Never Used  Substance and Sexual Activity  . Alcohol use: No  . Drug use: Not Currently    Types: Marijuana  . Sexual activity: Yes    Comment: undecided: [place]   Lifestyle  . Physical activity    Days per week: Not on file    Minutes per session: Not on file  . Stress: Not on file  Relationships  . Social Herbalist on phone: Not on file    Gets together: Not on file    Attends religious service: Not on file    Active member of club or organization: Not on file    Attends meetings of clubs or organizations: Not on file    Relationship status: Not on  file  Other Topics Concern  . Not on file  Social History Narrative  . Not on file    Family History  Problem Relation Age of Onset  . Healthy Mother     Medications Prior to Admission  Medication Sig Dispense Refill Last Dose  . Prenatal Vit-Fe Fumarate-FA (PRENATAL MULTIVITAMIN) TABS tablet Take 1 tablet by mouth daily at 12 noon.   06/30/2019 at Unknown time  . docusate sodium (COLACE) 100 MG capsule Take 1 capsule (100 mg total) by mouth 2 (two) times daily as needed. (Patient not taking: Reported on 07/01/2019) 30 capsule 2 Not Taking at Unknown time    No Known Allergies  Review of Systems: Negative except for what is mentioned in HPI.  Physical Exam: BP 140/74 (BP Location: Left Arm)   Pulse 84   Temp 98.5 F (36.9 C) (Oral)   Resp 18   Ht 5\' 8"   (1.727 m)   Wt 113.9 kg   LMP 09/25/2018 (LMP Unknown)   BMI 38.16 kg/m  CONSTITUTIONAL: Well-developed, well-nourished female in no acute distress.  HENT:  Normocephalic, atraumatic, External right and left ear normal. Oropharynx is clear and moist EYES: Conjunctivae and EOM are normal. Pupils are equal, round, and reactive to light. No scleral icterus.  NECK: Normal range of motion, supple, no masses SKIN: Skin is warm and dry. No rash noted. Not diaphoretic. No erythema. No pallor. NEUROLOGIC: Alert and oriented to person, place, and time. Normal reflexes, muscle tone coordination. No cranial nerve deficit noted. PSYCHIATRIC: Normal mood and affect. Normal behavior. Normal judgment and thought content. CARDIOVASCULAR: Normal heart rate noted, regular rhythm RESPIRATORY: Effort and breath sounds normal, no problems with respiration noted ABDOMEN: Soft, nontender, nondistended, gravid. MUSCULOSKELETAL: Normal range of motion. No edema and no tenderness. 2+ distal pulses.  Cervical Exam: Dilatation 3.5 cm   Effacement 60-70%   Station -2   Presentation: cephalic FHT:  Baseline rate 125 bpm   Variability moderate  Accelerations present   Decelerations none Contractions: Every 2-6 mins   Pertinent Labs/Studies:   Results for orders placed or performed during the hospital encounter of 07/01/19 (from the past 24 hour(s))  ROM Plus ([facility] only)     Status: None   Collection Time: 07/01/19  5:01 AM  Result Value Ref Range   Rom Plus POSITIVE   SARS Coronavirus 2 by RT PCR (hospital order, performed in [facility] lab) Nasopharyngeal Nasopharyngeal Swab     Status: None   Collection Time: 07/01/19  5:19 AM   Specimen: Nasopharyngeal Swab  Result Value Ref Range   SARS Coronavirus 2 NEGATIVE NEGATIVE  CBC     Status: Abnormal   Collection Time: 07/01/19  6:30 AM  Result Value Ref Range   WBC 8.4 4.0 - 10.5 K/uL   RBC 3.73 (L) 3.87 - 5.11 MIL/uL   Hemoglobin 11.2 (L) 12.0 - 15.0  g/dL   HCT 40.934.4 (L) 81.136.0 - 91.446.0 %   MCV 92.2 80.0 - 100.0 fL   MCH 30.0 26.0 - 34.0 pg   MCHC 32.6 30.0 - 36.0 g/dL   RDW 78.213.8 95.611.5 - 21.315.5 %   Platelets 151 150 - 400 K/uL   nRBC 0.0 0.0 - 0.2 %  Type and screen     Status: None   Collection Time: 07/01/19  6:30 AM  Result Value Ref Range   ABO/RH(D) A POS    Antibody Screen NEG    Sample Expiration      07/04/2019,2359  Performed at [facility], [address] [place], [place] [address]     Assessment : Diana Estrada is a 18 y.o. G2P1001 at [redacted]w[redacted]d being admitted for PROM at term. GBS negative. Moderate obesity in pregnancy.   Plan: Labor: Expectant management.  Induction/Augmentation as needed as per protocol. Analgesia as needed.  Patient ultimately desires epidural.  FWB: Reassuring fetal heart tracing.  GBS negative Delivery plan: Hopeful for vaginal delivery    Hildred Laser, MD [facility]

## 2019-07-01 NOTE — Progress Notes (Signed)
Intrapartum Progress Note  S: Patient doing well, no complaints. Epidural in place.   O: Blood pressure (!) 120/48, pulse (!) 104, temperature 98.3 F (36.8 C), temperature source Oral, resp. rate 16, height 5\' 8"  (1.727 m), weight 113.9 kg, last menstrual period 09/25/2018, SpO2 99 %. Gen App: NAD, comfortable.  Abdomen: soft, gravid FHT: baseline 120 bpm.  Accels present.  Decels present - x 1 at ~ 3:30 pm with 2 minute deceleration present after initiation of Pitocin. moderate in degree variability.   Tocometer: contractions q 3-6 minutes Cervix: 8/90/-1 Extremities: Nontender, no edema.  Pitocin: 4 mIU  Labs:  No new labs  Assessment:  1: SIUP at [redacted]w[redacted]d 2. PROM, GBS neg  Plan:  1. Patient initially started on Pitocin for augmentation with fetal intolerance (deceleration noted at dosage of 2 mIU. Pitocin stopped.  Patient allowed to rest with more reassuring tracing. Pitocin recently restarted, currently at 4 mIU. 2. Anticipate vaginal delivery soon.    Rubie Maid, MD 07/01/2019 6:30 PM

## 2019-07-01 NOTE — OB Triage Note (Signed)
Pt is a 18y/o G2P1 at [redacted]w[redacted]d with c/o leaking of fluid since 0406AM and contractions every 2-3 minutes. Pt rate ctx pain 7/10 in lower back. Pt states +FM. Pt denies VB. Monitors applied and assessing. Initial FHT 125.

## 2019-07-01 NOTE — Anesthesia Preprocedure Evaluation (Signed)
Anesthesia Evaluation  Patient identified by MRN, date of birth, ID band Patient awake    Reviewed: Allergy & Precautions, H&P , Patient's Chart, lab work & pertinent test results  Airway Mallampati: II  TM Distance: >3 FB Neck ROM: full    Dental no notable dental hx.    Pulmonary neg pulmonary ROS, former smoker,           Cardiovascular negative cardio ROS       Neuro/Psych negative neurological ROS  negative psych ROS   GI/Hepatic Neg liver ROS, GERD  ,  Endo/Other  negative endocrine ROS  Renal/GU negative Renal ROS  negative genitourinary   Musculoskeletal   Abdominal   Peds  Hematology negative hematology ROS (+)   Anesthesia Other Findings   Reproductive/Obstetrics (+) Pregnancy                             Anesthesia Physical Anesthesia Plan  ASA: II  Anesthesia Plan: Epidural   Post-op Pain Management:    Induction:   PONV Risk Score and Plan:   Airway Management Planned:   Additional Equipment:   Intra-op Plan:   Post-operative Plan:   Informed Consent: I have reviewed the patients History and Physical, chart, labs and discussed the procedure including the risks, benefits and alternatives for the proposed anesthesia with the patient or authorized representative who has indicated his/her understanding and acceptance.       Plan Discussed with: Anesthesiologist and CRNA  Anesthesia Plan Comments:         Anesthesia Quick Evaluation  

## 2019-07-01 NOTE — Anesthesia Procedure Notes (Signed)
Epidural  Start time: 07/01/2019 1:05 PM End time: 07/01/2019 1:18 PM  Staffing Anesthesiologist: Gunnar Fusi, MD Resident/CRNA: Jerrye Noble, CRNA Performed: resident/CRNA   Preanesthetic Checklist Completed: patient identified, site marked, surgical consent, pre-op evaluation, timeout performed, IV checked, risks and benefits discussed and monitors and equipment checked  Epidural Patient position: sitting Prep: ChloraPrep Patient monitoring: continuous pulse ox and blood pressure Approach: midline Location: L3-L4 Injection technique: LOR saline  Needle:  Needle type: Tuohy  Needle gauge: 17 G Needle length: 9 cm Needle insertion depth: 8 cm Catheter size: 19 Gauge Catheter at skin depth: 12 cm Test dose: 1.5% lidocaine with Epi 1:200 K  Assessment Events: blood not aspirated, injection not painful, no injection resistance, negative IV test and no paresthesia  Additional Notes Negative test dose, negative paresthesia.  Reason for block:procedure for pain

## 2019-07-01 NOTE — Progress Notes (Signed)
Done by BD RN

## 2019-07-02 LAB — CBC
HCT: 34.3 % — ABNORMAL LOW (ref 36.0–46.0)
Hemoglobin: 11.1 g/dL — ABNORMAL LOW (ref 12.0–15.0)
MCH: 30.1 pg (ref 26.0–34.0)
MCHC: 32.4 g/dL (ref 30.0–36.0)
MCV: 93 fL (ref 80.0–100.0)
Platelets: 159 10*3/uL (ref 150–400)
RBC: 3.69 MIL/uL — ABNORMAL LOW (ref 3.87–5.11)
RDW: 13.9 % (ref 11.5–15.5)
WBC: 15.5 10*3/uL — ABNORMAL HIGH (ref 4.0–10.5)
nRBC: 0 % (ref 0.0–0.2)

## 2019-07-02 MED ORDER — SENNOSIDES-DOCUSATE SODIUM 8.6-50 MG PO TABS
2.0000 | ORAL_TABLET | ORAL | Status: DC
  Administered 2019-07-03: 10:00:00 2 via ORAL
  Filled 2019-07-02: qty 2

## 2019-07-02 MED ORDER — ONDANSETRON HCL 4 MG PO TABS: 4.0000 mg | ORAL_TABLET | ORAL | Status: DC | PRN

## 2019-07-02 MED ORDER — WITCH HAZEL-GLYCERIN EX PADS: 1.0000 "application " | MEDICATED_PAD | CUTANEOUS | Status: DC | PRN

## 2019-07-02 MED ORDER — SENNOSIDES-DOCUSATE SODIUM 8.6-50 MG PO TABS: 2.0000 | ORAL_TABLET | ORAL | Status: DC

## 2019-07-02 MED ORDER — DIBUCAINE (PERIANAL) 1 % EX OINT: 1.0000 "application " | TOPICAL_OINTMENT | CUTANEOUS | Status: DC | PRN

## 2019-07-02 MED ORDER — IBUPROFEN 800 MG PO TABS
ORAL_TABLET | ORAL | Status: AC
  Filled 2019-07-02: qty 1

## 2019-07-02 MED ORDER — IBUPROFEN 800 MG PO TABS
800.0000 mg | ORAL_TABLET | Freq: Four times a day (QID) | ORAL | Status: DC
  Administered 2019-07-02 – 2019-07-03 (×7): 800 mg via ORAL
  Filled 2019-07-02 (×6): qty 1

## 2019-07-02 MED ORDER — PRENATAL MULTIVITAMIN CH
1.0000 | ORAL_TABLET | Freq: Every day | ORAL | Status: DC
  Administered 2019-07-02 – 2019-07-03 (×2): 1 via ORAL
  Filled 2019-07-02 (×2): qty 1

## 2019-07-02 MED ORDER — SIMETHICONE 80 MG PO CHEW: 80.0000 mg | CHEWABLE_TABLET | ORAL | Status: DC | PRN

## 2019-07-02 MED ORDER — DIPHENHYDRAMINE HCL 25 MG PO CAPS: 25.0000 mg | ORAL_CAPSULE | Freq: Four times a day (QID) | ORAL | Status: DC | PRN

## 2019-07-02 MED ORDER — ONDANSETRON HCL 4 MG/2ML IJ SOLN: 4.0000 mg | INTRAMUSCULAR | Status: DC | PRN

## 2019-07-02 MED ORDER — ACETAMINOPHEN 325 MG PO TABS
650.0000 mg | ORAL_TABLET | ORAL | Status: DC | PRN
  Administered 2019-07-02 – 2019-07-03 (×4): 650 mg via ORAL
  Filled 2019-07-02 (×4): qty 2

## 2019-07-02 MED ORDER — ZOLPIDEM TARTRATE 5 MG PO TABS: 5.0000 mg | ORAL_TABLET | Freq: Every evening | ORAL | Status: DC | PRN

## 2019-07-02 MED ORDER — IBUPROFEN 800 MG PO TABS: 800.0000 mg | ORAL_TABLET | Freq: Three times a day (TID) | ORAL | 1 refills | Status: DC | PRN

## 2019-07-02 MED ORDER — COCONUT OIL OIL: 1.0000 "application " | TOPICAL_OIL | Status: DC | PRN

## 2019-07-02 MED ORDER — BENZOCAINE-MENTHOL 20-0.5 % EX AERO
1.0000 "application " | INHALATION_SPRAY | CUTANEOUS | Status: DC | PRN
  Filled 2019-07-02: qty 56

## 2019-07-02 NOTE — Progress Notes (Signed)
Post Partum Day # 1, s/p SVD  Subjective: no complaints, up ad lib, voiding and tolerating PO  Objective: Temp:  [98 F (36.7 C)-99.2 F (37.3 C)] 98 F (36.7 C) (11/21 0303) Pulse Rate:  [76-131] 78 (11/21 0303) Resp:  [15-20] 18 (11/21 0303) BP: (100-138)/(44-87) 127/65 (11/21 0303) SpO2:  [98 %-100 %] 100 % (11/21 0303)  Physical Exam:  General: alert and no distress  Lungs: clear to auscultation bilaterally Breasts: normal appearance, no masses or tenderness Heart: regular rate and rhythm, S1, S2 normal, no murmur, click, rub or gallop Abdomen: soft, non-tender; bowel sounds normal; no masses,  no organomegaly Pelvis: Lochia: appropriate, Uterine Fundus: firm Extremities: DVT Evaluation: No evidence of DVT seen on physical exam. Negative Homan's sign. No cords or calf tenderness. No significant calf/ankle edema.  Recent Labs    07/01/19 0630 07/02/19 0550  HGB 11.2* 11.1*  HCT 34.4* 34.3*    Assessment/Plan: Doing well postpartum Bottle feeding Declines circumcision for female infant Contraception: Mirena IUD Plan for discharge tomorrow.    LOS: 1 day   Rubie Maid, MD [facility] 07/02/2019 7:59 AM

## 2019-07-02 NOTE — Discharge Summary (Signed)
OB Discharge Summary     Patient Name: Diana Estrada DOB: [birth year] MRN: [id number]  Date of admission: 07/01/2019 Delivering MD: Rubie Maid   Date of discharge: 07/03/2019  Admitting diagnosis:  PROM Intrauterine pregnancy: 104w6d     Secondary diagnosis:  Active Problems: Moderate obesity in pregnancy  Additional problems: None     Discharge diagnosis: Term Pregnancy Delivered                                                                                                Post partum procedures:None  Augmentation: Pitocin  Complications: None  Hospital course:  Onset of Labor With Vaginal Delivery     18 y.o. yo [id number] at [redacted]w[redacted]d was admitted in Latent Labor on 07/01/2019. Patient had an uncomplicated labor course as follows:  Membrane Rupture Time/Date: 4:06 AM ,07/01/2019   Intrapartum Procedures: Episiotomy: None [1]                                         Lacerations:  None [1]  Patient had a delivery of a Viable infant. 07/01/2019  Information for the patient's newborn:  Kelsi, Benham [[id number]]  Delivery Method: [place] had an uncomplicated postpartum course.  She is ambulating, tolerating a regular diet, passing flatus, and urinating well. Patient is discharged home in stable condition on 07/02/19.   Physical exam  Vitals:   07/02/19 1152 07/02/19 1942 07/02/19 2329 07/03/19 0907  BP: 124/69 126/72 123/66 138/65  Pulse: 83 88 86 75  Resp: 18 18 18 20   Temp: 98 F (36.7 C) 98.4 F (36.9 C) (!) 97.5 F (36.4 C) 98.4 F (36.9 C)  TempSrc: Oral Oral Oral Oral  SpO2:  100% 100% 100%  Weight:      Height:       General: alert and no distress Lochia: appropriate Uterine Fundus: firm Incision: N/A DVT Evaluation: No evidence of DVT seen on physical exam. Negative Homan's sign. No cords or calf tenderness. No significant calf/ankle edema.   Labs: Lab Results  Component Value Date   WBC 15.5 (H) 07/02/2019   HGB 11.1 (L) 07/02/2019    HCT 34.3 (L) 07/02/2019   MCV 93.0 07/02/2019   PLT 159 07/02/2019   CMP Latest Ref Rng & Units 02/27/2014  Glucose 65 - 99 mg/dL 99  BUN 9 - 21 mg/dL 9  Creatinine 0.60 - 1.30 mg/dL 0.88  Sodium 132 - 141 mmol/L 143(H)  Potassium 3.3 - 4.7 mmol/L 3.8  Chloride 97 - 107 mmol/L 111(H)  CO2 16 - 25 mmol/L 25  Calcium 9.3 - 10.7 mg/dL 8.6(L)  Total Protein 6.4 - 8.6 g/dL 8.3  Total Bilirubin 0.2 - 1.0 mg/dL 0.5  Alkaline Phos Unit/L 87  AST 15 - 37 Unit/L 29  ALT 12 - 78 U/L 24    Discharge instruction: per After Visit Summary.  After visit meds:  Allergies as of 07/02/2019   No Known Allergies     Medication List  TAKE these medications   docusate sodium 100 MG capsule Commonly known as: COLACE Take 1 capsule (100 mg total) by mouth 2 (two) times daily as needed.   ibuprofen 800 MG tablet Commonly known as: ADVIL Take 1 tablet (800 mg total) by mouth every 8 (eight) hours as needed.   prenatal multivitamin Tabs tablet Take 1 tablet by mouth daily at 12 noon.       Diet: routine diet  Activity: Advance as tolerated. Pelvic rest for 6 weeks.   Outpatient follow up:6 weeks Follow up Appt:No future appointments. Follow up Visit:No follow-ups on file.  Postpartum contraception: IUD Mirena  Newborn Data: Live born female  Birth Weight: 8 lb 14.5 oz (4040 g) APGAR: 8, 9  Newborn Delivery   Birth date/time: 07/01/2019 22:24:00 Delivery type: Vaginal, Spontaneous      Baby Feeding: Bottle Disposition:home with mother   07/02/2019 Hildred Laser, MD  [facility]

## 2019-07-02 NOTE — Progress Notes (Signed)
During assessment, pt noted to have positive clonus in BLE, 2 beats in both lower extremities. Assessment otherwise WNL, pt denies headache, vision changes, lungs clear, reflexes WNL. VS stable and WNL. Called Dr. Marcelline Mates to notify of finding, will plan to reassess at next round, RN will call to notify if any changes.

## 2019-07-02 NOTE — Discharge Instructions (Signed)

## 2019-07-03 NOTE — Progress Notes (Signed)
Reviewed D/C instructions with pt and family. Pt verbalized understanding of teaching. Discharged to home via W/C. Pt to schedule f/u appt.  

## 2019-07-03 NOTE — Anesthesia Postprocedure Evaluation (Signed)
Anesthesia Post Note  Patient: Diana Estrada  Procedure(s) Performed: AN AD [place]  Patient location during evaluation: Mother Baby Anesthesia Type: Epidural Level of consciousness: awake and alert Pain management: pain level controlled Vital Signs Assessment: post-procedure vital signs reviewed and stable Respiratory status: spontaneous breathing, nonlabored ventilation and respiratory function stable Cardiovascular status: stable Postop Assessment: no headache, no backache and able to ambulate Anesthetic complications: no     Last Vitals:  Vitals:   07/02/19 2329 07/03/19 0907  BP: 123/66 138/65  Pulse: 86 75  Resp: 18 20  Temp: (!) 36.4 C 36.9 C  SpO2: 100% 100%    Last Pain:  Vitals:   07/03/19 0930  TempSrc:   PainSc: 2                  Martha Clan

## 2019-08-24 ENCOUNTER — Encounter: Payer: Medicaid Other | Admitting: Obstetrics and Gynecology

## 2019-08-30 ENCOUNTER — Encounter: Payer: Self-pay | Admitting: Obstetrics and Gynecology

## 2019-08-30 ENCOUNTER — Other Ambulatory Visit: Payer: Self-pay

## 2019-08-30 ENCOUNTER — Ambulatory Visit (INDEPENDENT_AMBULATORY_CARE_PROVIDER_SITE_OTHER): Payer: Medicaid Other | Admitting: Obstetrics and Gynecology

## 2019-08-30 DIAGNOSIS — Z30430 Encounter for insertion of intrauterine contraceptive device: Secondary | ICD-10-CM | POA: Diagnosis not present

## 2019-08-30 DIAGNOSIS — Z3202 Encounter for pregnancy test, result negative: Secondary | ICD-10-CM | POA: Diagnosis not present

## 2019-08-30 DIAGNOSIS — F418 Other specified anxiety disorders: Secondary | ICD-10-CM

## 2019-08-30 DIAGNOSIS — O99345 Other mental disorders complicating the puerperium: Secondary | ICD-10-CM

## 2019-08-30 LAB — POCT URINE PREGNANCY: Preg Test, Ur: NEGATIVE

## 2019-08-30 MED ORDER — ESCITALOPRAM OXALATE 10 MG PO TABS: 10.0000 mg | ORAL_TABLET | Freq: Every day | ORAL | 6 refills | Status: DC

## 2019-08-30 NOTE — Progress Notes (Signed)
OBSTETRICS POSTPARTUM CLINIC PROGRESS NOTE  Subjective:     Diana Estrada is a 19 y.o. G47P2002 female who presents for a postpartum visit. She is 6 weeks postpartum following a spontaneous vaginal delivery. I have fully reviewed the prenatal and intrapartum course. The delivery was at 39.6 gestational weeks.  Anesthesia: epidural. Postpartum course has been well. Baby's course has been well. Baby is feeding by bottle- formula . Bleeding: patient has resumed menses, with LMP 1 week ago (cannot recall exact date). Bowel function is normal. Bladder function is normal. Patient is sexually active. Contraception method desired is IUD. Postpartum depression screening: negative.    Patient does report that she feels like her anxiety is a little more elevated. Has moments where she feels palpitations and tightness in her chest. Has a remote history of anxiety and depression in the past, was on medications (~ 5 years ago), but discontinued when she became pregnant with her first child.   The following portions of the patient's history were reviewed and updated as appropriate: allergies, current medications, past family history, past medical history, past social history, past surgical history and problem list.  Review of Systems Pertinent items noted in HPI and remainder of comprehensive ROS otherwise negative.   Objective:    BP 107/66   Pulse 83   Ht 5\' 8"  (1.727 m)   Wt 228 lb (103.4 kg)   LMP 09/25/2018 (LMP Unknown)   Breastfeeding No   BMI 34.67 kg/m   General:  alert and no distress   Breasts:  inspection negative, no nipple discharge or bleeding, no masses or nodularity palpable  Lungs: clear to auscultation bilaterally  Heart:  regular rate and rhythm, S1, S2 normal, no murmur, click, rub or gallop  Abdomen: soft, non-tender; bowel sounds normal; no masses,  no organomegaly.    Vulva:  normal  Vagina: normal vagina, no discharge, exudate, lesion, or erythema  Cervix:  no cervical  motion tenderness and no lesions  Corpus: normal size, contour, position, consistency, mobility, non-tender  Adnexa:  normal adnexa and no mass, fullness, tenderness  Rectal Exam: Not performed.         Labs:  Lab Results  Component Value Date   HGB 11.1 (L) 07/02/2019     Assessment:   1. Postpartum examination following vaginal delivery 2. Postpartum anxiety 3. IUD insertion  Plan:    1. Contraception: IUD, uncertain between 07/04/2019 and Pargard.  Discussed risks/benefits of both.  Patient opts for Paragard. Will insert today. Should use back up method for the next  week or maintain abstinence.  2. Reviewed prior medications for h/o depression and anxiety, was on Lexapro and Trazadone.  Discussed with patient option for counseling or resuming medication. Patient desires to resume medication. Patient will resume on Lexapro. Will start at 10 mg.  3. Follow up in: 4 weeks for IUD string check, and f/u with anxiety symptoms.      GYNECOLOGY OFFICE PROCEDURE NOTE  Diana Estrada is a 19 y.o. 26 here for Paragard IUD insertion. No GYN concerns.     IUD Insertion Procedure Note Patient identified, informed consent performed, consent signed.   Discussed risks of irregular bleeding, cramping, infection, malpositioning or misplacement of the IUD outside the uterus which may require further procedure such as laparoscopy. Also discussed >99% contraception efficacy, increased risk of ectopic pregnancy with failure of method.  Time out was performed.  Urine pregnancy test negative.  Speculum placed in the vagina.  Cervix visualized.  Cleaned with  Betadine x 2.  Grasped anteriorly with a single tooth tenaculum.  Uterus sounded to 8.5 cm.  Paragard IUD placed per manufacturer's recommendations.  Strings trimmed to 3 cm. Tenaculum was removed, good hemostasis noted.  Patient tolerated procedure well.   Patient was given post-procedure instructions.  She was advised to have backup  contraception for one week.  Patient was also asked to check IUD strings periodically and follow up in 4 weeks for IUD check.   Lot: [id number] Exp: 05/2025.     Diana Maid, MD [facility]

## 2019-08-30 NOTE — Progress Notes (Signed)
PT is present today for her postpartum visit. Pt stated that she is not breastfeeding and have had sexually intercourse recently, but used a condom. Pt stated that she would like to get  birth control but unsure of which one. EPDS= 3.  PHQ-9 15. UPT-NEG. Pt stated that she is doing well no complaints.

## 2019-08-30 NOTE — Patient Instructions (Signed)
Intrauterine Device Insertion, Care After  This sheet gives you information about how to care for yourself after your procedure. Your health care provider may also give you more specific instructions. If you have problems or questions, contact your health care provider. What can I expect after the procedure? After the procedure, it is common to have:  Cramps and pain in the abdomen.  Light bleeding (spotting) or heavier bleeding that is like your menstrual period. This may last for up to a few days.  Lower back pain.  Dizziness.  Headaches.  Nausea. Follow these instructions at home:  Before resuming sexual activity, check to make sure that you can feel the IUD string(s). You should be able to feel the end of the string(s) below the opening of your cervix. If your IUD string is in place, you may resume sexual activity. ? If you had a hormonal IUD inserted more than 7 days after your most recent period started, you will need to use a backup method of birth control for 7 days after IUD insertion. Ask your health care provider whether this applies to you.  Continue to check that the IUD is still in place by feeling for the string(s) after every menstrual period, or once a month.  Take over-the-counter and prescription medicines only as told by your health care provider.  Do not drive or use heavy machinery while taking prescription pain medicine.  Keep all follow-up visits as told by your health care provider. This is important. Contact a health care provider if:  You have bleeding that is heavier or lasts longer than a normal menstrual cycle.  You have a fever.  You have cramps or abdominal pain that get worse or do not get better with medicine.  You develop abdominal pain that is new or is not in the same area of earlier cramping and pain.  You feel lightheaded or weak.  You have abnormal or bad-smelling discharge from your vagina.  You have pain during sexual  activity.  You have any of the following problems with your IUD string(s): ? The string bothers or hurts you or your sexual partner. ? You cannot feel the string. ? The string has gotten longer.  You can feel the IUD in your vagina.  You think you may be pregnant, or you miss your menstrual period.  You think you may have an STI (sexually transmitted infection). Get help right away if:  You have flu-like symptoms.  You have a fever and chills.  You can feel that your IUD has slipped out of place. Summary  After the procedure, it is common to have cramps and pain in the abdomen. It is also common to have light bleeding (spotting) or heavier bleeding that is like your menstrual period.  Continue to check that the IUD is still in place by feeling for the string(s) after every menstrual period, or once a month.  Keep all follow-up visits as told by your health care provider. This is important.  Contact your health care provider if you have problems with your IUD string(s), such as the string getting longer or bothering you or your sexual partner. This information is not intended to replace advice given to you by your health care provider. Make sure you discuss any questions you have with your health care provider. Document Revised: 07/10/2017 Document Reviewed: 06/18/2016 Elsevier Patient Education  2020 [employer] Inc[employer]  

## 2019-11-02 ENCOUNTER — Encounter: Payer: Medicaid Other | Admitting: Obstetrics and Gynecology

## 2019-12-01 NOTE — Progress Notes (Signed)
Pt present for UTI symptoms. Pt c/o left side pain that causes her not to want to eat, urine odor and urine cloudiness.

## 2019-12-01 NOTE — Patient Instructions (Addendum)
Asymptomatic Bacteriuria  Asymptomatic bacteriuria is the presence of a large number of bacteria in the urine without the usual symptoms of burning or frequent urination. What are the causes? This condition is caused by an increase in bacteria in the urine. This increase can be caused by:  Bacteria entering the urinary tract, such as during sex.  A blockage in the urinary tract, such as from kidney stones or a tumor.  Bladder problems that prevent the bladder from emptying. What increases the risk? You are more likely to develop this condition if:  You have diabetes mellitus.  You are an elderly adult, especially if you are also in a long-term care facility.  You are pregnant and in the first trimester.  You have kidney stones.  You are female.  You have had a kidney transplant.  You have a leaky kidney tube valve (reflux).  You had a urinary catheter for a long period of time. What are the signs or symptoms? There are no symptoms of this condition. How is this diagnosed? This condition is diagnosed with a urine test. Because this condition does not cause symptoms, it is usually diagnosed when a urine sample is taken to treat or diagnose another condition, such as pregnancy or kidney problems. Most women who are in their first trimester of pregnancy are screened for asymptomatic bacteriuria. How is this treated? Usually, treatment is not needed for this condition. Treating the condition can lead to other problems, such as a yeast infection or the growth of bacteria that do not respond to treatment (antibiotic-resistant bacteria). Some people, such as pregnant women and people with kidney transplants, do need treatment with antibiotic medicines to prevent kidney infection (pyelonephritis). In pregnant women, kidney infection can lead to premature labor, fetal growth restriction, or newborn death. Follow these instructions at home: Medicines  Take over-the-counter and prescription  medicines only as told by your health care provider.  If you were prescribed an antibiotic medicine, take it as told by your health care provider. Do not stop taking the antibiotic even if you start to feel better. General instructions  Monitor your condition for any changes.  Drink enough fluid to keep your urine clear or pale yellow.  Go to the bathroom more often to keep your bladder empty.  If you are female, keep the area around your vagina and rectum clean. Wipe yourself from front to back after urinating.  Keep all follow-up visits as told by your health care provider. This is important. Contact a health care provider if:  You notice any new symptoms, such as back pain or burning while urinating. Get help right away if:  You develop signs of an infection such as: ? A burning sensation when you urinate. ? Have pain when you urinate. ? Develop an intense need to urinate. ? Urinating more frequently. ? Back pain or pelvic pain. ? Fever or chills.  You have blood in your urine.  Your urine becomes discolored or cloudy.  Your urine smells bad.  You have severe pain that cannot be controlled with medicine. Summary  Asymptomatic bacteriuria is the presence of a large number of bacteria in the urine without the usual symptoms of burning or frequent urination.  Usually, treatment is not needed for this condition. Treating the condition can lead to other problems, such as too much yeast and the growth of antibiotic-resistant bacteria.  Some people, such as pregnant women and people with kidney transplants, do need treatment with antibiotic medicines to prevent kidney   infection (pyelonephritis).  If you were prescribed an antibiotic medicine, take it as told by your health care provider. Do not stop taking the antibiotic even if you start to feel better. This information is not intended to replace advice given to you by your health care provider. Make sure you discuss any  questions you have with your health care provider. Document Revised: 11/16/2018 Document Reviewed: 07/22/2016 Elsevier Patient Education  2020 [employer] Inc.  

## 2019-12-02 ENCOUNTER — Other Ambulatory Visit: Payer: Self-pay

## 2019-12-02 ENCOUNTER — Encounter: Payer: Medicaid Other | Admitting: Certified Nurse Midwife

## 2019-12-02 ENCOUNTER — Ambulatory Visit (INDEPENDENT_AMBULATORY_CARE_PROVIDER_SITE_OTHER): Payer: Medicaid Other | Admitting: Obstetrics and Gynecology

## 2019-12-02 ENCOUNTER — Encounter: Payer: Self-pay | Admitting: Obstetrics and Gynecology

## 2019-12-02 VITALS — BP 115/78 | HR 76 | Ht 68.0 in | Wt 233.0 lb

## 2019-12-02 DIAGNOSIS — R399 Unspecified symptoms and signs involving the genitourinary system: Secondary | ICD-10-CM | POA: Diagnosis not present

## 2019-12-02 DIAGNOSIS — N898 Other specified noninflammatory disorders of vagina: Secondary | ICD-10-CM

## 2019-12-02 DIAGNOSIS — Z87440 Personal history of urinary (tract) infections: Secondary | ICD-10-CM | POA: Diagnosis not present

## 2019-12-02 LAB — POCT URINALYSIS DIPSTICK
Bilirubin, UA: NEGATIVE
Blood, UA: NEGATIVE
Glucose, UA: NEGATIVE
Ketones, UA: NEGATIVE
Leukocytes, UA: NEGATIVE
Nitrite, UA: NEGATIVE
Protein, UA: NEGATIVE
Spec Grav, UA: 1.025 (ref 1.010–1.025)
Urobilinogen, UA: 0.2 E.U./dL
pH, UA: 6 (ref 5.0–8.0)

## 2019-12-02 NOTE — Progress Notes (Signed)
   GYNECOLOGY CLINIC PROGRESS NOTE Subjective:    Diana Estrada is a 19 y.o. G69P2002 female who complains of "cloudy urine with white speckles in it. ". This has been ongoing for several days.  Patient also complains of vaginal discharge. Patient denies back pain and fever. She also denies hematuria, dysuria, urgency or frequency.  Patient does have a history of recurrent UTI.  Has been treated for a UTI in March (at [facility]) and April (by urgent care). Patient does not have a history of pyelonephritis. Denies any recent new sexual partners. Does note new onset of a white discharge, no odor.     The following portions of the patient's history were reviewed and updated as appropriate: allergies, current medications, past family history, past medical history, past social history, past surgical history and problem list. Review of Systems Pertinent items noted in HPI and remainder of comprehensive ROS otherwise negative.    Objective:    BP 115/78   Pulse 76   Ht 5\' 8"  (1.727 m)   Wt 233 lb (105.7 kg)   LMP 11/16/2019   BMI 35.43 kg/m  General: alert, cooperative and no distress  Abdomen: soft, non-tender, without masses or organomegaly and no suprapubic tenderness  Back: CVA tenderness absent  GU: defer exam   Laboratory:  Results for orders placed or performed in visit on 12/02/19  POCT urinalysis dipstick  Result Value Ref Range   Color, UA yellow    Clarity, UA clear    Glucose, UA Negative Negative   Bilirubin, UA neg    Ketones, UA neg    Spec Grav, UA 1.025 1.010 - 1.025   Blood, UA neg    pH, UA 6.0 5.0 - 8.0   Protein, UA Negative Negative   Urobilinogen, UA 0.2 0.2 or 1.0 E.U./dL   Nitrite, UA neg    Leukocytes, UA Negative Negative   Appearance yellow;clear    Odor       Assessment:   UTI symptoms  History of recurrent UTI's Vaginal discharge   Plan: Plan:   1. Medications: not indicated at this time based on current UA.  2. Maintain adequate hydration,  use of Cranberry juice/Azo. Discussed hygiene tips with bathing prior to sex, using moist tissue wipes after voiding.  Discussed that patient may benefit from a Urology consult if she continues to experience recurrences. Can also consider suppression therapy.   3. Vaginal discharge noted, Nuswab self-collection performed. Will notify of results.   Return to clinic for any scheduled appointments or for any gynecologic concerns as needed.    12/04/19, MD [facility]   3. Follow up if symptoms not improving, and prn.

## 2019-12-05 LAB — NUSWAB VAGINITIS PLUS (VG+)
Atopobium vaginae: HIGH Score — AB
Candida albicans, NAA: POSITIVE — AB
Candida glabrata, NAA: NEGATIVE
Chlamydia trachomatis, NAA: NEGATIVE
Neisseria gonorrhoeae, NAA: NEGATIVE
Trich vag by NAA: NEGATIVE

## 2019-12-05 MED ORDER — FLUCONAZOLE 150 MG PO TABS: 150.0000 mg | ORAL_TABLET | Freq: Once | ORAL | 3 refills | Status: AC

## 2019-12-05 MED ORDER — METRONIDAZOLE 500 MG PO TABS: 500.0000 mg | ORAL_TABLET | Freq: Two times a day (BID) | ORAL | 0 refills | Status: AC

## 2019-12-05 NOTE — Addendum Note (Signed)
Addended by: Fabian November on: 12/05/2019 02:21 PM   Modules accepted: Orders

## 2019-12-19 ENCOUNTER — Other Ambulatory Visit: Payer: Self-pay

## 2019-12-19 MED ORDER — FLUCONAZOLE 150 MG PO TABS
150.0000 mg | ORAL_TABLET | Freq: Once | ORAL | 0 refills | Status: AC
Start: 2019-12-19 — End: 2019-12-19

## 2019-12-28 ENCOUNTER — Encounter: Payer: Medicaid Other | Admitting: Obstetrics and Gynecology

## 2020-01-03 ENCOUNTER — Ambulatory Visit (INDEPENDENT_AMBULATORY_CARE_PROVIDER_SITE_OTHER): Payer: Medicaid Other | Admitting: Obstetrics and Gynecology

## 2020-01-03 ENCOUNTER — Encounter: Payer: Self-pay | Admitting: Obstetrics and Gynecology

## 2020-01-03 ENCOUNTER — Other Ambulatory Visit: Payer: Self-pay

## 2020-01-03 VITALS — BP 121/78 | HR 86 | Ht 68.0 in | Wt 233.5 lb

## 2020-01-03 DIAGNOSIS — N898 Other specified noninflammatory disorders of vagina: Secondary | ICD-10-CM

## 2020-01-03 DIAGNOSIS — R399 Unspecified symptoms and signs involving the genitourinary system: Secondary | ICD-10-CM | POA: Diagnosis not present

## 2020-01-03 DIAGNOSIS — N219 Calculus of lower urinary tract, unspecified: Secondary | ICD-10-CM

## 2020-01-03 LAB — POCT URINALYSIS DIPSTICK
Bilirubin, UA: NEGATIVE
Blood, UA: NEGATIVE
Glucose, UA: NEGATIVE
Ketones, UA: NEGATIVE
Leukocytes, UA: NEGATIVE
Nitrite, UA: NEGATIVE
Protein, UA: NEGATIVE
Spec Grav, UA: 1.015 (ref 1.010–1.025)
Urobilinogen, UA: 0.2 E.U./dL
pH, UA: 7 (ref 5.0–8.0)

## 2020-01-03 NOTE — Patient Instructions (Signed)
Kidney Stones  Kidney stones are solid, rock-like deposits that form inside of the kidneys. The kidneys are a pair of organs that make urine. A kidney stone may form in a kidney and move into other parts of the urinary tract, including the tubes that connect the kidneys to the bladder (ureters), the bladder, and the tube that carries urine out of the body (urethra). As the stone moves through these areas, it can cause intense pain and block the flow of urine. Kidney stones are created when high levels of certain minerals are found in the urine. The stones are usually passed out of the body through urination, but in some cases, medical treatment may be needed to remove them. What are the causes? Kidney stones may be caused by:  A condition in which certain glands produce too much parathyroid hormone (primary hyperparathyroidism), which causes too much calcium buildup in the blood.  A buildup of uric acid crystals in the bladder (hyperuricosuria). Uric acid is a chemical that the body produces when you eat certain foods. It usually exits the body in the urine.  Narrowing (stricture) of one or both of the ureters.  A kidney blockage that is present at birth (congenital obstruction).  Past surgery on the kidney or the ureters, such as gastric bypass surgery. What increases the risk? The following factors may make you more likely to develop this condition:  Having had a kidney stone in the past.  Having a family history of kidney stones.  Not drinking enough water.  Eating a diet that is high in protein, salt (sodium), or sugar.  Being overweight or obese. What are the signs or symptoms? Symptoms of a kidney stone may include:  Pain in the side of the abdomen, right below the ribs (flank pain). Pain usually spreads (radiates) to the groin.  Needing to urinate frequently or urgently.  Painful urination.  Blood in the urine (hematuria).  Nausea.  Vomiting.  Fever and chills. How  is this diagnosed? This condition may be diagnosed based on:  Your symptoms and medical history.  A physical exam.  Blood tests.  Urine tests. These may be done before and after the stone passes out of your body through urination.  Imaging tests, such as a CT scan, abdominal X-ray, or ultrasound.  A procedure to examine the inside of the bladder (cystoscopy). How is this treated? Treatment for kidney stones depends on the size, location, and makeup of the stones. Kidney stones will often pass out of the body through urination. You may need to:  Increase your fluid intake to help pass the stone. In some cases, you may be given fluids through an IV and may need to be monitored at the hospital.  Take medicine for pain.  Make changes in your diet to help prevent kidney stones from coming back. Sometimes, medical procedures are needed to remove a kidney stone. This may involve:  A procedure to break up kidney stones using: ? A focused beam of light (laser therapy). ? Shock waves (extracorporeal shock wave lithotripsy).  Surgery to remove kidney stones. This may be needed if you have severe pain or have stones that block your urinary tract. Follow these instructions at home: Medicines  Take over-the-counter and prescription medicines only as told by your health care provider.  Ask your health care provider if the medicine prescribed to you requires you to avoid driving or using heavy machinery. Eating and drinking  Drink enough fluid to keep your urine pale yellow.   You may be instructed to drink at least 8-10 glasses of water each day. This will help you pass the kidney stone.  If directed, change your diet. This may include: ? Limiting how much sodium you eat. ? Eating more fruits and vegetables. ? Limiting how much animal protein--such as red meat, poultry, fish, and eggs--you eat.  Follow instructions from your health care provider about eating or drinking  restrictions. General instructions  Collect urine samples as told by your health care provider. You may need to collect a urine sample: ? 24 hours after you pass the stone. ? 8-12 weeks after passing the kidney stone, and every 6-12 months after that.  Strain your urine every time you urinate, for as long as directed. Use the strainer that your health care provider recommends.  Do not throw out the kidney stone after passing it. Keep the stone so it can be tested by your health care provider. Testing the makeup of your kidney stone may help prevent you from getting kidney stones in the future.  Keep all follow-up visits as told by your health care provider. This is important. You may need follow-up X-rays or ultrasounds to make sure that your stone has passed. How is this prevented? To prevent another kidney stone:  Drink enough fluid to keep your urine pale yellow. This is the best way to prevent kidney stones.  Eat a healthy diet and follow recommendations from your health care provider about foods to avoid. You may be instructed to eat a low-protein diet. Recommendations vary depending on the type of kidney stone that you have.  Maintain a healthy weight. Where to find more information  National Kidney Foundation (NKF): www.kidney.org  [employer] (UCF): www.urologyhealth.org Contact a health care provider if:  You have pain that gets worse or does not get better with medicine. Get help right away if:  You have a fever or chills.  You develop severe pain.  You develop new abdominal pain.  You faint.  You are unable to urinate. Summary  Kidney stones are solid, rock-like deposits that form inside of the kidneys.  Kidney stones can cause nausea, vomiting, blood in the urine, abdominal pain, and the urge to urinate frequently.  Treatment for kidney stones depends on the size, location, and makeup of the stones. Kidney stones will often pass out of the body  through urination.  Kidney stones can be prevented by drinking enough fluids, eating a healthy diet, and maintaining a healthy weight. This information is not intended to replace advice given to you by your health care provider. Make sure you discuss any questions you have with your health care provider. Document Revised: 12/14/2018 Document Reviewed: 12/14/2018 Elsevier Patient Education  2020 Elsevier Inc.   Dietary Guidelines to Help Prevent Kidney Stones Kidney stones are deposits of minerals and salts that form inside your kidneys. Your risk of developing kidney stones may be greater depending on your diet, your lifestyle, the medicines you take, and whether you have certain medical conditions. Most people can reduce their chances of developing kidney stones by following the instructions below. Depending on your overall health and the type of kidney stones you tend to develop, your dietitian may give you more specific instructions. What are tips for following this plan? Reading food labels  Choose foods with "no salt added" or "low-salt" labels. Limit your sodium intake to less than 1500 mg per day.  Choose foods with calcium for each meal and snack. Try to eat   about 300 mg of calcium at each meal. Foods that contain 200-500 mg of calcium per serving include: ? 8 oz (237 ml) of milk, fortified nondairy milk, and fortified fruit juice. ? 8 oz (237 ml) of kefir, yogurt, and soy yogurt. ? 4 oz (118 ml) of tofu. ? 1 oz of cheese. ? 1 cup (300 g) of dried figs. ? 1 cup (91 g) of cooked broccoli. ? 1-3 oz can of sardines or mackerel.  Most people need 1000 to 1500 mg of calcium each day. Talk to your dietitian about how much calcium is recommended for you. Shopping  Buy plenty of fresh fruits and vegetables. Most people do not need to avoid fruits and vegetables, even if they contain nutrients that may contribute to kidney stones.  When shopping for convenience foods, choose: ? Whole  pieces of fruit. ? Premade salads with dressing on the side. ? Low-fat fruit and yogurt smoothies.  Avoid buying frozen meals or prepared deli foods.  Look for foods with live cultures, such as yogurt and kefir. Cooking  Do not add salt to food when cooking. Place a salt shaker on the table and allow each person to add his or her own salt to taste.  Use vegetable protein, such as beans, textured vegetable protein (TVP), or tofu instead of meat in pasta, casseroles, and soups. Meal planning   Eat less salt, if told by your dietitian. To do this: ? Avoid eating processed or premade food. ? Avoid eating fast food.  Eat less animal protein, including cheese, meat, poultry, or fish, if told by your dietitian. To do this: ? Limit the number of times you have meat, poultry, fish, or cheese each week. Eat a diet free of meat at least 2 days a week. ? Eat only one serving each day of meat, poultry, fish, or seafood. ? When you prepare animal protein, cut pieces into small portion sizes. For most meat and fish, one serving is about the size of one deck of cards.  Eat at least 5 servings of fresh fruits and vegetables each day. To do this: ? Keep fruits and vegetables on hand for snacks. ? Eat 1 piece of fruit or a handful of berries with breakfast. ? Have a salad and fruit at lunch. ? Have two kinds of vegetables at dinner.  Limit foods that are high in a substance called oxalate. These include: ? Spinach. ? Rhubarb. ? Beets. ? Potato chips and french fries. ? Nuts.  If you regularly take a diuretic medicine, make sure to eat at least 1-2 fruits or vegetables high in potassium each day. These include: ? Avocado. ? Banana. ? Orange, prune, carrot, or tomato juice. ? Baked potato. ? Cabbage. ? Beans and split peas. General instructions   Drink enough fluid to keep your urine clear or pale yellow. This is the most important thing you can do.  Talk to your health care provider and  dietitian about taking daily supplements. Depending on your health and the cause of your kidney stones, you may be advised: ? Not to take supplements with vitamin C. ? To take a calcium supplement. ? To take a daily probiotic supplement. ? To take other supplements such as magnesium, fish oil, or vitamin B6.  Take all medicines and supplements as told by your health care provider.  Limit alcohol intake to no more than 1 drink a day for nonpregnant women and 2 drinks a day for men. One drink equals 12 oz   of beer, 5 oz of wine, or 1 oz of hard liquor.  Lose weight if told by your health care provider. Work with your dietitian to find strategies and an eating plan that works best for you. What foods are not recommended? Limit your intake of the following foods, or as told by your dietitian. Talk to your dietitian about specific foods you should avoid based on the type of kidney stones and your overall health. Grains Breads. Bagels. Rolls. Baked goods. Salted crackers. Cereal. Pasta. Vegetables Spinach. Rhubarb. Beets. Canned vegetables. Pickles. Olives. Meats and other protein foods Nuts. Nut butters. Large portions of meat, poultry, or fish. Salted or cured meats. Deli meats. Hot dogs. Sausages. Dairy Cheese. Beverages Regular soft drinks. Regular vegetable juice. Seasonings and other foods Seasoning blends with salt. Salad dressings. Canned soups. Soy sauce. Ketchup. Barbecue sauce. Canned pasta sauce. Casseroles. Pizza. Lasagna. Frozen meals. Potato chips. French fries. Summary  You can reduce your risk of kidney stones by making changes to your diet.  The most important thing you can do is drink enough fluid. You should drink enough fluid to keep your urine clear or pale yellow.  Ask your health care provider or dietitian how much protein from animal sources you should eat each day, and also how much salt and calcium you should have each day. This information is not intended to  replace advice given to you by your health care provider. Make sure you discuss any questions you have with your health care provider. Document Revised: 11/17/2018 Document Reviewed: 07/08/2016 Elsevier Patient Education  2020 [employer] Inc.   

## 2020-01-03 NOTE — Progress Notes (Signed)
Pt present for cloudy urine. Pt c/o pelvic/rub pain, increase in vaginal discharge; with odor, blood in discharge and on Sunday 01/01/20 found a stone in her urine.

## 2020-01-03 NOTE — Progress Notes (Signed)
    GYNECOLOGY PROGRESS NOTE  Subjective:    Patient ID: Diana Estrada, female    DOB: [birth year], 19 y.o.   MRN: [id number]  HPI  Patient is a 19 y.o. G22P2002 female who presents for complaints of cloudy urine, pelvic pain, increase in vaginal discharge with odor and blood in her discharge 2 days ago.  She also reports that she recently found a stone in her urine.  Of note, patient has a h/o recurrent UTI's, also has been complaining of urinary symptoms for several weeks. Was treated last visit (12/02/2019) with an antibiotic for suspected UTI symptoms.  Also was diagnosed with yeast infection, treated with Diflucan.   The following portions of the patient's history were reviewed and updated as appropriate: allergies, current medications, past family history, past medical history, past social history, past surgical history and problem list.  Review of Systems Pertinent items noted in HPI and remainder of comprehensive ROS otherwise negative.   Objective:   Blood pressure 121/78, pulse 86, height 5\' 8"  (1.727 m), weight 233 lb 8 oz (105.9 kg), last menstrual period 12/16/2019, not currently breastfeeding. General appearance: alert and no distress Abdomen: soft, non-tender; bowel sounds normal; no masses,  no organomegalyBack: Back: symmetric, no curvature. ROM normal. No CVA tenderness. Pelvic: external genitalia normal, rectovaginal septum normal.  Vagina with small amount of thin white discharge.  Cervix normal appearing, no lesions and no motion tenderness. Bimanual exam not performed.  Extremities: Non-tender, no edema.    Labs:  Microscopic wet-mount exam shows scant clue cells, no hyphae, no trichomonads, no white blood cells. KOH done.    Assessment/Plan:   UTI symptoms - Plan: POCT urinalysis dipstick, CT RENAL STONE STUDY, 02/15/2020 Retroperitoneal Comp, Ambulatory referral to Urology  Calculus of lower urinary tract - Plan: CT RENAL STONE STUDY, [place] Retroperitoneal Comp, Ambulatory  referral to Urology  Vaginal discharge - Plan: WET PREP, negative results, given reassurance.

## 2020-01-04 ENCOUNTER — Encounter: Payer: Self-pay | Admitting: Obstetrics and Gynecology

## 2020-01-30 NOTE — Progress Notes (Signed)
01/31/20 9:29 AM   [name] 10-Feb-2001 [id number]  Referring provider: Hildred Laser, MD [address] [place],  [place] [address] Chief Complaint  Patient presents with  . Recurrent UTI  . Nephrolithiasis    HPI: Diana Estrada is a 19 y.o. F who presents today for the evaluation and managment of UTI symptoms.   Visited PCP on 01/03/20 c/o cloudy urine, pelvic pain, increase in vaginal discharge with odor and blood in her discharge 2 days prior to visit. She also reported of seeing stone in her urine. Patient has a h/o recurrent UTI's, also has been complaining of urinary symptoms for several weeks. Was treated last visit (12/02/2019) with an antibiotic for suspected UTI symptoms. Also was diagnosed with yeast infection, treated with Diflucan.   F/u POCT UA unremarkable.   She states of having her son on 11/20 and had 2 infections since then. She has recently tried probiotics and notes of having a daughter.  No UTI symptoms today.   She was having issues w/ constipations for a while however improved. Her constipation started a few weeks ago associated w/ hemorrhoid bleeding after wiping. She attributes this to passing a stone which she noticed when showering a few weeks ago. She also reports of intermittent aching pain. No FHx or personal hx of kidney stones.   She reports of pain w/ sexual intercourse.   PMH: Past Medical History:  Diagnosis Date  . History of recurrent UTIs 07/30/2016  . Medical history non-contributory     Surgical History: Past Surgical History:  Procedure Laterality Date  . NO PAST SURGERIES      Home Medications:  Allergies as of 01/31/2020   No Known Allergies     Medication List       Accurate as of January 31, 2020 11:59 PM. If you have any questions, ask your nurse or doctor.        STOP taking these medications   escitalopram 10 MG tablet Commonly known as: Lexapro Stopped by: Vanna Scotland, MD     TAKE these medications    PARAGARD INTRAUTERINE COPPER IU by Intrauterine route. Inserted 08/30/2019   PROBIOTIC DAILY PO Take by mouth.       Allergies: No Known Allergies  Family History: Family History  Problem Relation Age of Onset  . Healthy Mother     Social History:  reports that she quit smoking about 5 years ago. She has never used smokeless tobacco. She reports current alcohol use. She reports previous drug use. Drug: Marijuana.   Physical Exam: BP 130/80   Pulse 70   Wt 233 lb (105.7 kg)   LMP 01/12/2020   BMI 35.43 kg/m   Constitutional:  Alert and oriented, No acute distress. HEENT: [place] AT, moist mucus membranes.  Trachea midline, no masses. Cardiovascular: No clubbing, cyanosis, or edema. Respiratory: Normal respiratory effort, no increased work of breathing. Skin: No rashes, bruises or suspicious lesions. Neurologic: Grossly intact, no focal deficits, moving all 4 extremities. Psychiatric: Normal mood and affect.  Laboratory Data:  Urinalysis Negative   Assessment & Plan:    1. rUTI  Manage conservatively  Counseled pt on prevention techniques including probiotics, and cranberry tablets  Advised to return to our office specifically if she has any UTI type symptoms Discussed sexual hygiene  Already scheduled for CT by PCP for stone eval, will f/u thereafter with patient to review results F/u for CT scan  2. Hx of nephrolithiasis  Possible stone passing few  weeks ago  No recurrent symptoms  Return for virtual visit w/ CT scan which is already scheduled to review results    [facility] [address] [place], [place] [address] [phone]  I, Lucas Mallow, am acting as a scribe for Dr. Hollice Espy,  I have reviewed the above documentation for accuracy and completeness, and I agree with the above.   Hollice Espy, MD  I spent 45 total minutes on the day of the encounter including pre-visit review of the medical  record, face-to-face time with the patient, and post visit ordering of labs/imaging/tests.

## 2020-01-31 ENCOUNTER — Other Ambulatory Visit: Payer: Self-pay

## 2020-01-31 ENCOUNTER — Ambulatory Visit (INDEPENDENT_AMBULATORY_CARE_PROVIDER_SITE_OTHER): Payer: Medicaid Other | Admitting: Urology

## 2020-01-31 ENCOUNTER — Encounter: Payer: Self-pay | Admitting: Urology

## 2020-01-31 VITALS — BP 130/80 | HR 70 | Wt 233.0 lb

## 2020-01-31 DIAGNOSIS — R399 Unspecified symptoms and signs involving the genitourinary system: Secondary | ICD-10-CM

## 2020-02-02 LAB — URINALYSIS, COMPLETE
Bilirubin, UA: NEGATIVE
Glucose, UA: NEGATIVE
Ketones, UA: NEGATIVE
Leukocytes,UA: NEGATIVE
Nitrite, UA: NEGATIVE
Protein,UA: NEGATIVE
RBC, UA: NEGATIVE
Specific Gravity, UA: 1.02 (ref 1.005–1.030)
Urobilinogen, Ur: 0.2 mg/dL (ref 0.2–1.0)
pH, UA: 7 (ref 5.0–7.5)

## 2020-02-02 LAB — MICROSCOPIC EXAMINATION

## 2020-02-07 ENCOUNTER — Ambulatory Visit: Payer: Medicaid Other

## 2020-02-07 ENCOUNTER — Ambulatory Visit: Admission: RE | Admit: 2020-02-07 | Payer: Medicaid Other | Source: Ambulatory Visit

## 2020-02-21 ENCOUNTER — Ambulatory Visit: Payer: Medicaid Other

## 2020-02-21 ENCOUNTER — Ambulatory Visit: Payer: Medicaid Other | Attending: Obstetrics and Gynecology

## 2020-02-22 ENCOUNTER — Telehealth: Payer: Medicaid Other | Admitting: Urology

## 2020-02-22 ENCOUNTER — Other Ambulatory Visit: Payer: Self-pay

## 2020-02-22 ENCOUNTER — Telehealth: Payer: Self-pay | Admitting: *Deleted

## 2020-02-22 NOTE — Telephone Encounter (Signed)
Attempted to contact patient multiple times regarding follow up visit. Unable to get a hold of patient.

## 2020-02-23 ENCOUNTER — Encounter: Payer: Self-pay | Admitting: Urology

## 2020-04-09 ENCOUNTER — Ambulatory Visit (INDEPENDENT_AMBULATORY_CARE_PROVIDER_SITE_OTHER): Payer: Medicaid Other | Admitting: Obstetrics and Gynecology

## 2020-04-09 ENCOUNTER — Other Ambulatory Visit: Payer: Self-pay

## 2020-04-09 ENCOUNTER — Telehealth: Payer: Self-pay | Admitting: Obstetrics and Gynecology

## 2020-04-09 DIAGNOSIS — R102 Pelvic and perineal pain: Secondary | ICD-10-CM

## 2020-04-09 LAB — POCT URINALYSIS DIPSTICK
Bilirubin, UA: NEGATIVE
Blood, UA: NEGATIVE
Glucose, UA: NEGATIVE
Ketones, UA: NEGATIVE
Leukocytes, UA: NEGATIVE
Nitrite, UA: NEGATIVE
Protein, UA: NEGATIVE
Spec Grav, UA: 1.015 (ref 1.010–1.025)
Urobilinogen, UA: 0.2 E.U./dL
pH, UA: 6.5 (ref 5.0–8.0)

## 2020-04-09 NOTE — Telephone Encounter (Signed)
Pt called in and stated that she had UTI Symptoms, the pt was placed on the nurse sch. for a urine drop off. The nurse called up here and stated that this pt needs to see Urology. Called pt back to let know what the nurse said.  The pt then informed me that Urology doesn't take her insurance. I called back to the nurse and told her what the pt stated and the nurse said to leave her on the schedule. The pt verbally understood.

## 2020-04-09 NOTE — Progress Notes (Signed)
Patient comes in today for a urine drop off. Patient states that her side has been hurting and has had some nausea. Patient states that she has an odor during intercourse. Patient has appointment next week to see Dr. Valentino Saxon.

## 2020-04-17 ENCOUNTER — Encounter: Payer: Self-pay | Admitting: Obstetrics and Gynecology

## 2020-04-17 ENCOUNTER — Other Ambulatory Visit: Payer: Self-pay

## 2020-04-17 ENCOUNTER — Ambulatory Visit (INDEPENDENT_AMBULATORY_CARE_PROVIDER_SITE_OTHER): Payer: Medicaid Other | Admitting: Obstetrics and Gynecology

## 2020-04-17 ENCOUNTER — Other Ambulatory Visit (HOSPITAL_COMMUNITY)
Admission: RE | Admit: 2020-04-17 | Discharge: 2020-04-17 | Disposition: A | Discharge status: Home / Self Care | Payer: Medicaid Other | Source: Ambulatory Visit | Attending: Obstetrics and Gynecology | Admitting: Obstetrics and Gynecology

## 2020-04-17 VITALS — BP 124/69 | HR 85 | Ht 68.0 in | Wt 225.4 lb

## 2020-04-17 DIAGNOSIS — Z113 Encounter for screening for infections with a predominantly sexual mode of transmission: Secondary | ICD-10-CM | POA: Diagnosis not present

## 2020-04-17 DIAGNOSIS — B9689 Other specified bacterial agents as the cause of diseases classified elsewhere: Secondary | ICD-10-CM | POA: Diagnosis present

## 2020-04-17 DIAGNOSIS — N760 Acute vaginitis: Secondary | ICD-10-CM | POA: Diagnosis not present

## 2020-04-17 DIAGNOSIS — Z975 Presence of (intrauterine) contraceptive device: Secondary | ICD-10-CM | POA: Diagnosis not present

## 2020-04-17 MED ORDER — METRONIDAZOLE 500 MG PO TABS: 500.0000 mg | ORAL_TABLET | Freq: Two times a day (BID) | ORAL | 0 refills | Status: DC

## 2020-04-17 MED ORDER — FLUCONAZOLE 150 MG PO TABS: 150.0000 mg | ORAL_TABLET | Freq: Once | ORAL | 1 refills | Status: AC

## 2020-04-17 NOTE — Progress Notes (Signed)
    GYNECOLOGY PROGRESS NOTE  Subjective:    Patient ID: Diana Estrada, female    DOB: [birth year], 19 y.o.   MRN: [id number]  HPI  Patient is a 19 y.o. G70P2002 female who presents for complaints of vaginal discharge x 1 week.  Notes vaginal itching, discharge with odor. Discharge is grey.  Odor is worse after sex.  Initially thought she had a UTI, however recent UA last week was negative. Just started menses today.   The following portions of the patient's history were reviewed and updated as appropriate: allergies, current medications, past family history, past medical history, past social history, past surgical history and problem list.  Review of Systems Pertinent items noted in HPI and remainder of comprehensive ROS otherwise negative.   Objective:   Blood pressure 124/69, pulse 85, height 5\' 8"  (1.727 m), weight 225 lb 6.4 oz (102.2 kg), last menstrual period 04/17/2020, not currently breastfeeding. General appearance: alert and no distress Abdomen: soft, non-tender; bowel sounds normal; no masses,  no organomegaly Pelvic: external genitalia normal, rectovaginal septum normal.  Vagina with small amount of dark red blood, no visible discharge.  Cervix normal appearing, no lesions and no motion tenderness. IUD threads visible, ~ 2.5 cm in length. Uterus mobile, nontender, normal shape and size.  Adnexae non-palpable, nontender bilaterally.    Assessment:   1. Bacterial vaginosis   2. Screening examination for STD (sexually transmitted disease)   3. IUD (intrauterine device) in place    Plan:   1. Based on symptoms patient likely has bacterial vaginosis. Unable to perform wet prep due to the presence of blood and no visible discharge noted. Will perform Nuswab. We will go ahead and prophylactically initiate treatment, prescribed Flagyl. Also given prescription for Diflucan in case of possible yeast infection after antibiotic treatment. 2. While performing exam patient was offered STD  testing, which she desired. She also requests to have STD serology screening as well (including HIV). Will order. Encourage safe sex practices. 3. Will notify patient of results by phone. Return to clinic for any scheduled appointments or for any gynecologic concerns as needed. Patient should schedule annual exam for 3 to 4 months.   06/17/2020, MD [facility]

## 2020-04-17 NOTE — Progress Notes (Signed)
Pt present for vaginal issues. Pt c/o of vaginal itching and gray discharge without odor, pt noticed vaginal odor doing intercourse.

## 2020-04-17 NOTE — Patient Instructions (Signed)

## 2020-04-18 LAB — HIV ANTIBODY (ROUTINE TESTING W REFLEX): HIV Screen 4th Generation wRfx: NONREACTIVE

## 2020-04-18 LAB — RPR: RPR Ser Ql: NONREACTIVE

## 2020-04-19 LAB — CERVICOVAGINAL ANCILLARY ONLY
Bacterial Vaginitis (gardnerella): POSITIVE — AB
Candida Glabrata: NEGATIVE
Candida Vaginitis: NEGATIVE
Chlamydia: NEGATIVE
Comment: NEGATIVE
Comment: NEGATIVE
Comment: NEGATIVE
Comment: NEGATIVE
Comment: NEGATIVE
Comment: NORMAL
Neisseria Gonorrhea: NEGATIVE
Trichomonas: NEGATIVE

## 2020-05-12 ENCOUNTER — Encounter: Payer: Self-pay | Admitting: Obstetrics and Gynecology

## 2020-05-12 NOTE — Patient Instructions (Signed)
Urinary Tract Infection, Adult A urinary tract infection (UTI) is an infection of any part of the urinary tract. The urinary tract includes:  The kidneys.  The ureters.  The bladder.  The urethra. These organs make, store, and get rid of pee (urine) in the body. What are the causes? This is caused by germs (bacteria) in your genital area. These germs grow and cause swelling (inflammation) of your urinary tract. What increases the risk? You are more likely to develop this condition if:  You have a small, thin tube (catheter) to drain pee.  You cannot control when you pee or poop (incontinence).  You are female, and: ? You use these methods to prevent pregnancy:  A medicine that kills sperm (spermicide).  A device that blocks sperm (diaphragm). ? You have low levels of a female hormone (estrogen). ? You are pregnant.  You have genes that add to your risk.  You are sexually active.  You take antibiotic medicines.  You have trouble peeing because of: ? A prostate that is bigger than normal, if you are female. ? A blockage in the part of your body that drains pee from the bladder (urethra). ? A kidney stone. ? A nerve condition that affects your bladder (neurogenic bladder). ? Not getting enough to drink. ? Not peeing often enough.  You have other conditions, such as: ? Diabetes. ? A weak disease-fighting system (immune system). ? Sickle cell disease. ? Gout. ? Injury of the spine. What are the signs or symptoms? Symptoms of this condition include:  Needing to pee right away (urgently).  Peeing often.  Peeing small amounts often.  Pain or burning when peeing.  Blood in the pee.  Pee that smells bad or not like normal.  Trouble peeing.  Pee that is cloudy.  Fluid coming from the vagina, if you are female.  Pain in the belly or lower back. Other symptoms include:  Throwing up (vomiting).  No urge to eat.  Feeling mixed up (confused).  Being tired  and grouchy (irritable).  A fever.  Watery poop (diarrhea). How is this treated? This condition may be treated with:  Antibiotic medicine.  Other medicines.  Drinking enough water. Follow these instructions at home:  Medicines  Take over-the-counter and prescription medicines only as told by your doctor.  If you were prescribed an antibiotic medicine, take it as told by your doctor. Do not stop taking it even if you start to feel better. General instructions  Make sure you: ? Pee until your bladder is empty. ? Do not hold pee for a long time. ? Empty your bladder after sex. ? Wipe from front to back after pooping if you are a female. Use each tissue one time when you wipe.  Drink enough fluid to keep your pee pale yellow.  Keep all follow-up visits as told by your doctor. This is important. Contact a doctor if:  You do not get better after 1-2 days.  Your symptoms go away and then come back. Get help right away if:  You have very bad back pain.  You have very bad pain in your lower belly.  You have a fever.  You are sick to your stomach (nauseous).  You are throwing up. Summary  A urinary tract infection (UTI) is an infection of any part of the urinary tract.  This condition is caused by germs in your genital area.  There are many risk factors for a UTI. These include having a small, thin   tube to drain pee and not being able to control when you pee or poop.  Treatment includes antibiotic medicines for germs.  Drink enough fluid to keep your pee pale yellow. This information is not intended to replace advice given to you by your health care provider. Make sure you discuss any questions you have with your health care provider. Document Revised: 07/15/2018 Document Reviewed: 02/04/2018 Elsevier Patient Education  2020 [employer]vier Inc.  

## 2020-05-29 ENCOUNTER — Other Ambulatory Visit: Payer: Self-pay

## 2020-05-29 ENCOUNTER — Ambulatory Visit: Payer: Medicaid Other | Admitting: Physician Assistant

## 2020-05-29 DIAGNOSIS — B9689 Other specified bacterial agents as the cause of diseases classified elsewhere: Secondary | ICD-10-CM

## 2020-05-29 DIAGNOSIS — Z113 Encounter for screening for infections with a predominantly sexual mode of transmission: Secondary | ICD-10-CM

## 2020-05-29 DIAGNOSIS — N760 Acute vaginitis: Secondary | ICD-10-CM | POA: Diagnosis not present

## 2020-05-29 LAB — WET PREP FOR TRICH, YEAST, CLUE
Trichomonas Exam: NEGATIVE
Yeast Exam: NEGATIVE

## 2020-05-29 MED ORDER — METRONIDAZOLE 500 MG PO TABS: 500.0000 mg | ORAL_TABLET | Freq: Two times a day (BID) | ORAL | 0 refills | Status: AC

## 2020-05-29 NOTE — Progress Notes (Signed)
Wet mount reviewed by provider, pt treated for BV per provider orders. Provider orders completed.

## 2020-05-31 ENCOUNTER — Encounter: Payer: Self-pay | Admitting: Physician Assistant

## 2020-05-31 NOTE — Progress Notes (Signed)
[facility] Department STI clinic/screening visit  Subjective:  Diana Estrada is a 19 y.o. female being seen today for an STI screening visit. The patient reports they do have symptoms.  Patient reports that they do not desire a pregnancy in the next year.   They reported they are not interested in discussing contraception today.  No LMP recorded.   Patient has the following medical conditions:   Patient Active Problem List   Diagnosis Date Noted  . Excess weight gain in pregnancy, third trimester 05/13/2019  . Increased body mass index (BMI) 12/16/2018  . History of recurrent UTIs 07/30/2016  . Eating disorder, unspecified 08/29/2013  . Cannabis abuse 08/29/2013  . 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 Social anxiety disorder 08/27/2013    Chief Complaint  Patient presents with  . SEXUALLY TRANSMITTED DISEASE    STD Screening except bloodwork    HPI  Patient reports that she has had a grayish discharge with odor and slight itching for about 3 days.  Denies other symptoms, chronic conditions, surgeries and regular medicines.  Reports last HIV test was July 2021 and last pap was also done at that time.  States last period was 05/15/2020 and she is using a Paragard as her BCM.   See flowsheet for further details and programmatic requirements.    The following portions of the patient's history were reviewed and updated as appropriate: allergies, current medications, past medical history, past social history, past surgical history and problem list.  Objective:  There were no vitals filed for this visit.  Physical Exam Constitutional:      General: She is not in acute distress.    Appearance: Normal appearance.  HENT:     Head: Normocephalic and atraumatic.  Eyes:     Conjunctiva/sclera: Conjunctivae normal.  Pulmonary:     Effort: Pulmonary effort is normal.  Musculoskeletal:     Cervical back: Neck supple. No  tenderness.  Skin:    General: Skin is warm and dry.     Findings: No bruising, erythema, lesion or rash.  Neurological:     Mental Status: She is alert and oriented to person, place, and time.  Psychiatric:        Mood and Affect: Mood normal.        Behavior: Behavior normal.        Thought Content: Thought content normal.        Judgment: Judgment normal.      Assessment and Plan:  Diana Estrada is a 19 y.o. female presenting to the [facility] Department for STI screening  1. Screening for STD (sexually transmitted disease) Patient into clinic with symptoms. Patient declines blood work today. Patient opts to self-collect vaginal samples for GC/Chlamydia and wet mount today.  Counseled how to collect sample for more accurate results.  Rec condoms with all sex. Await test results.  Counseled that RN will call if needs to RTC for treatment once results are back. - Chlamydia/Gonorrhea [place] [facility] - WET PREP FOR TRICH, YEAST, CLUE  2. BV (bacterial vaginosis) Treat for BV with Metronidazole 500 mg #14 1 po BID for 7 days with food, no EtOH for 24 hr before and until 72 hr after completing medicine. No sex for 10 days. Enc to use OTC antifungal cream if has itching during or just after using antibiotic. - metroNIDAZOLE (FLAGYL) 500 MG tablet; Take 1 tablet (500 mg total) by mouth 2 (two) times daily for  7 days.  Dispense: 14 tablet; Refill: 0     No follow-ups on file.  Future Appointments  Date Time Provider Department Center  07/20/2020  3:00 PM Hildred Laser, MD [facility] None    Marylynn Pearson [place], [place]

## 2020-07-12 ENCOUNTER — Ambulatory Visit: Payer: Medicaid Other

## 2020-07-20 ENCOUNTER — Encounter: Payer: Medicaid Other | Admitting: Obstetrics and Gynecology

## 2020-09-02 ENCOUNTER — Encounter: Payer: Self-pay | Admitting: Urology

## 2020-09-05 ENCOUNTER — Other Ambulatory Visit: Payer: Self-pay | Admitting: Family Medicine

## 2020-09-05 ENCOUNTER — Other Ambulatory Visit: Payer: Self-pay

## 2020-09-05 ENCOUNTER — Encounter: Payer: Self-pay | Admitting: Family Medicine

## 2020-09-05 ENCOUNTER — Ambulatory Visit: Payer: Medicaid Other | Admitting: Family Medicine

## 2020-09-05 DIAGNOSIS — Z113 Encounter for screening for infections with a predominantly sexual mode of transmission: Secondary | ICD-10-CM

## 2020-09-05 MED ORDER — METRONIDAZOLE 500 MG PO TABS: 500.0000 mg | ORAL_TABLET | Freq: Two times a day (BID) | ORAL | 0 refills | Status: AC

## 2020-09-05 NOTE — Progress Notes (Signed)
[facility] Department STI clinic/screening visit  Subjective:  Matison Nuccio is a 20 y.o. female being seen today for an STI screening visit. The patient reports they do have symptoms.  Patient reports that they do not desire a pregnancy in the next year.   They reported they are not interested in discussing contraception today.  Patient's last menstrual period was 08/11/2020 (approximate).   Patient has the following medical conditions:   Patient Active Problem List   Diagnosis Date Noted  . Excess weight gain in pregnancy, third trimester 05/13/2019  . Increased body mass index (BMI) 12/16/2018  . History of recurrent UTIs 07/30/2016  . Eating disorder, unspecified 08/29/2013  . Cannabis abuse 08/29/2013  . 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 Social anxiety disorder 08/27/2013    Chief Complaint  Patient presents with  . SEXUALLY TRANSMITTED DISEASE    Screening     HPI  Patient reports having symptoms.  Patient   Last HIV test per review of record was 04/17/2020 Patient reports never has had pap.  Patient education on screening for pap starting at 55 yrs of age.    See flowsheet for further details and programmatic requirements.    The following portions of the patient's history were reviewed and updated as appropriate: allergies, current medications, past medical history, past social history, past surgical history and problem list.  Objective:  There were no vitals filed for this visit.  Physical Exam Vitals and nursing note reviewed.  Constitutional:      Appearance: Normal appearance.  HENT:     Head: Normocephalic and atraumatic.     Mouth/Throat:     Mouth: Mucous membranes are moist.     Pharynx: Oropharynx is clear. No oropharyngeal exudate or posterior oropharyngeal erythema.  Pulmonary:     Effort: Pulmonary effort is normal.  Chest:  Breasts:     Right: No axillary adenopathy or  supraclavicular adenopathy.     Left: No axillary adenopathy or supraclavicular adenopathy.    Abdominal:     Palpations: Abdomen is soft. There is no mass.     Tenderness: There is no abdominal tenderness. There is no rebound.  Genitourinary:    General: Normal vulva.     Exam position: Lithotomy position.     Pubic Area: No rash or pubic lice.      Labia:        Right: No rash or lesion.        Left: No rash or lesion.      Vagina: Normal. No vaginal discharge, erythema, bleeding or lesions.     Cervix: No cervical motion tenderness, discharge, friability, lesion or erythema.     Uterus: Normal.      Adnexa: Right adnexa normal and left adnexa normal.     Rectum: Normal.     Comments: External genitalia without, lice, nits, erythema, edema , lesions or inguinal adenopathy. Vagina with normal mucosa and discharge and pH equals >4.  Cervix without visual lesions, uterus firm, mobile, non-tender, no masses, CMT adnexal fullness or tenderness.   Musculoskeletal:     Cervical back: Normal range of motion and neck supple.  Lymphadenopathy:     Head:     Right side of head: No preauricular or posterior auricular adenopathy.     Left side of head: No preauricular or posterior auricular adenopathy.     Cervical: No cervical adenopathy.     Upper Body:     Right  upper body: No supraclavicular or axillary adenopathy.     Left upper body: No supraclavicular or axillary adenopathy.     Lower Body: No right inguinal adenopathy. No left inguinal adenopathy.  Skin:    General: Skin is warm and dry.     Findings: No rash.  Neurological:     Mental Status: She is alert and oriented to person, place, and time.  Psychiatric:        Mood and Affect: Mood normal.        Behavior: Behavior normal.      Assessment and Plan:  Marlise Fahr is a 20 y.o. female presenting to the [facility] Department for STI screening  1. Screening examination for venereal disease Discussed with  patient about causes and preventions of BV.   - Chlamydia/Gonorrhea [place] [facility] - WET PREP FOR TRICH, YEAST, CLUE - Syphilis Serology, [place] [facility] - HIV/HCV [place] [facility] - HBV Antigen/Antibody [facility]  Patient accepted all screenings including , vaginal CT/GC and bloodwork for HIV/RPR/HBV/HCV Patient meets criteria for HepB screening? Yes. Ordered? Yes Patient meets criteria for HepC screening? Yes. Ordered? Yes  Treat wet prep BV per standing order if + amine or + clue  Treatment needed Metronidazole 500 mg BID x 7 days.    Discussed time line for State [facility] results and that patient will be called with positive results and encouraged patient to call if she had not heard in 2 weeks.  Counseled to return or seek care for continued or worsening symptoms Recommended condom use with all sex  Patient is currently using Paraguard to prevent pregnancy.     Return for as needed.  Future Appointments  Date Time Provider Department Center  10/24/2020  3:00 PM Hildred Laser, MD [facility] None    Wendi Snipes, FNP

## 2020-09-05 NOTE — Patient Instructions (Addendum)
Bacterial Vaginosis  Bacterial vaginosis is an infection of the vagina. It happens when too many normal germs (healthy bacteria) grow in the vagina. This infection can make it easier to get other infections from sex (STIs). It is very important for pregnant women to get treated. This infection can cause babies to be born early or at a low birth weight. What are the causes? This infection is caused by an increase in certain germs that grow in the vagina. You cannot get this infection from toilet seats, bedsheets, swimming pools, or things that touch your vagina. What increases the risk?  Having sex with a new person or more than one person.  Having sex without protection.  Douching.  Having an intrauterine device (IUD).  Smoking.  Using drugs or drinking alcohol. These can lead you to do things that are risky.  Taking certain antibiotic medicines.  Being pregnant. What are the signs or symptoms? Some women have no symptoms. Symptoms may include:  A discharge from your vagina. It may be gray or white. It can be watery or foamy.  A fishy smell. This can happen after sex or during your menstrual period.  Itching in and around your vagina.  A feeling of burning or pain when you pee (urinate). How is this treated? This infection is treated with antibiotic medicines. These may be given to you as:  A pill.  A cream for your vagina.  A medicine that you put into your vagina (suppository). If the infection comes back after treatment, you may need more antibiotics. Follow these instructions at home: Medicines  Take over-the-counter and prescription medicines as told by your doctor.  Take or use your antibiotic medicine as told by your doctor. Do not stop taking or using it, even if you start to feel better. General instructions  If the person you have sex with is a woman, tell her that you have this infection. She will need to follow up with her doctor. If you have a female  partner, he does not need to be treated.  Do not have sex until you finish treatment.  Drink enough fluid to keep your pee pale yellow.  Keep your vagina and butt clean. ? Wash the area with warm water each day. ? Wipe from front to back after you use the toilet.  If you are breastfeeding a baby, ask your doctor if you should keep doing so during treatment.  Keep all follow-up visits. How is this prevented? Self-care  Do not douche.  Use only warm water to wash around your vagina.  Wear underwear that is cotton or lined with cotton.  Do not wear tight pants and pantyhose, especially in the summer. Safe sex  Use protection when you have sex. This includes: ? Use condoms. ? Use dental dams. This is a thin layer that protects the mouth during oral sex.  Limit how many people you have sex with. To prevent this infection, it is best to have sex with just one person.  Get tested for STIs. The person you have sex with should also get tested. Drugs and alcohol  Do not smoke or use any products that contain nicotine or tobacco. If you need help quitting, ask your doctor.  Do not use drugs.  Do not drink alcohol if: ? Your doctor tells you not to drink. ? You are pregnant, may be pregnant, or are planning to become pregnant.  If you drink alcohol: ? Limit how much you have to 0-1 drink   a day. ? Know how much alcohol is in your drink. In the [place] one drink equals one 12 oz bottle of beer (355 mL), one 5 oz glass of wine (148 mL), or one 1 oz glass of hard liquor (44 mL). Where to find more information  Centers for Disease Control and Prevention: www.cdc.gov  American Sexual Health Association: www.ashastd.org  Office on Women's Health: www.womenshealth.gov Contact a doctor if:  Your symptoms do not get better, even after you are treated.  You have more discharge or pain when you pee.  You have a fever or chills.  You have pain in your belly (abdomen) or in the area  between your hips.  You have pain with sex.  You bleed from your vagina between menstrual periods. Summary  This infection can happen when too many germs (bacteria) grow in the vagina.  This infection can make it easier to get infections from sex (STIs). Treating this can lower that chance.  Get treated if you are pregnant. This infection can cause babies to be born early.  Do not stop taking or using your antibiotic medicine, even if you start to feel better. This information is not intended to replace advice given to you by your health care provider. Make sure you discuss any questions you have with your health care provider. Document Revised: 01/26/2020 Document Reviewed: 01/26/2020 Elsevier Patient Education  2021 [employer]  

## 2020-09-05 NOTE — Progress Notes (Signed)
Wet mount reviewed with provider. Patient tx'd for BV per VO Richmond Campbell, RN

## 2020-09-06 LAB — WET PREP FOR TRICH, YEAST, CLUE
Trichomonas Exam: NEGATIVE
Yeast Exam: NEGATIVE

## 2020-09-10 LAB — GONOCOCCUS CULTURE

## 2020-09-12 LAB — HM HIV SCREENING LAB: HM HIV Screening: NEGATIVE

## 2020-09-12 LAB — HM HEPATITIS C SCREENING LAB: HM Hepatitis Screen: NEGATIVE

## 2020-09-12 LAB — HEPATITIS B SURFACE ANTIGEN

## 2020-10-24 ENCOUNTER — Encounter: Payer: Medicaid Other | Admitting: Obstetrics and Gynecology

## 2020-11-01 DIAGNOSIS — T22132A Burn of first degree of left upper arm, initial encounter: Secondary | ICD-10-CM | POA: Diagnosis not present

## 2020-11-01 DIAGNOSIS — T304 Corrosion of unspecified body region, unspecified degree: Secondary | ICD-10-CM | POA: Diagnosis not present

## 2020-11-06 ENCOUNTER — Encounter: Payer: Self-pay | Admitting: Obstetrics and Gynecology

## 2021-02-14 DIAGNOSIS — Z20822 Contact with and (suspected) exposure to covid-19: Secondary | ICD-10-CM | POA: Diagnosis not present

## 2021-12-13 ENCOUNTER — Emergency Department: Payer: Medicaid Other

## 2021-12-13 ENCOUNTER — Other Ambulatory Visit: Payer: Self-pay

## 2021-12-13 ENCOUNTER — Emergency Department
Admission: EM | Admit: 2021-12-13 | Discharge: 2021-12-14 | Disposition: A | Discharge status: Home / Self Care | Payer: Medicaid Other | Attending: Emergency Medicine | Admitting: Emergency Medicine

## 2021-12-13 DIAGNOSIS — S82832A Other fracture of upper and lower end of left fibula, initial encounter for closed fracture: Secondary | ICD-10-CM | POA: Diagnosis not present

## 2021-12-13 DIAGNOSIS — S82839A Other fracture of upper and lower end of unspecified fibula, initial encounter for closed fracture: Secondary | ICD-10-CM

## 2021-12-13 DIAGNOSIS — S99912A Unspecified injury of left ankle, initial encounter: Secondary | ICD-10-CM | POA: Diagnosis not present

## 2021-12-13 DIAGNOSIS — Y9351 Activity, roller skating (inline) and skateboarding: Secondary | ICD-10-CM | POA: Insufficient documentation

## 2021-12-13 DIAGNOSIS — M7989 Other specified soft tissue disorders: Secondary | ICD-10-CM | POA: Diagnosis not present

## 2021-12-13 MED ORDER — HYDROCODONE-ACETAMINOPHEN 5-325 MG PO TABS
1.0000 | ORAL_TABLET | Freq: Once | ORAL | Status: AC
  Administered 2021-12-13: 1 via ORAL
  Filled 2021-12-13: qty 1

## 2021-12-13 MED ORDER — HYDROCODONE-ACETAMINOPHEN 5-325 MG PO TABS: 1.0000 | ORAL_TABLET | Freq: Four times a day (QID) | ORAL | 0 refills | Status: AC | PRN

## 2021-12-13 NOTE — Discharge Instructions (Addendum)
-  Take ibuprofen as needed for pain.  Utilize hydrocodone/acetaminophen sparingly and only as needed. ? ?-Follow-up with the orthopedist listed above, as discussed, for further evaluation and management. ? ?-Return to the emergency department anytime if you begin to experience any new or worsening symptoms. ?

## 2021-12-13 NOTE — ED Triage Notes (Signed)
Pt states was skating with family tonight when she injured left ankle and foot.  ?

## 2021-12-13 NOTE — ED Provider Notes (Signed)
? ?[facility] ?Provider Note ? ? ? None  ?  (approximate) ? ? ?History  ? ?Chief Complaint ?Ankle Injury ? ? ?HPI ?Diana Estrada is a 21 y.o. female, history of MDD, social anxiety disorder, eating disorder, cannabis abuse, recurrent UTIs, presents to the emergency department for evaluation of ankle injury.  Patient states that she was skating with her family tonight when she injured her left ankle/foot during a mechanical fall.  She is currently endorsing significant pain along the lateral aspect of her left ankle.  Denies any head injury or LOC.  Denies leg pain, knee pain, thigh pain, hip pain, chest pain, abdominal pain, shortness of breath, nausea/vomiting, dizziness/lightheadedness, or numbness/tingling upper/lower extremities ? ?History Limitations: No limitations. ? ?    ? ? ?Physical Exam  ?Triage Vital Signs: ?ED Triage Vitals [12/13/21 2129]  ?Enc Vitals Group  ?   BP   ?   Pulse   ?   Resp   ?   Temp   ?   Temp src   ?   SpO2   ?   Weight 240 lb (108.9 kg)  ?   Height 5\' 8"  (1.727 m)  ?   Head Circumference   ?   Peak Flow   ?   Pain Score   ?   Pain Loc   ?   Pain Edu?   ?   Excl. in [place]?   ? ? ?Most recent vital signs: ?Vitals:  ? 12/14/21 0013  ?BP: (!) 141/73  ?Pulse: 73  ?Resp: 18  ?SpO2: 100%  ? ? ?General: Awake, NAD.  ?Skin: Warm, dry. No rashes or lesions.  ?Eyes: PERRL. Conjunctivae normal.  ?CV: Good peripheral perfusion.  ?Resp: Normal effort.  ?Abd: Soft, non-tender. No distention.  ?Neuro: At baseline. No gross neurological deficits.  ? ?Focused Exam: Significant soft tissue swelling along the lateral malleolus.  Bony tenderness present.  Pulse, motor, sensation intact distally.  Range of motion limited due to pain.   ? ? ?Physical Exam ? ? ? ?ED Results / Procedures / Treatments  ?Labs ?(all labs ordered are listed, but only abnormal results are displayed) ?Labs Reviewed - No data to display ? ? ?EKG ?N/A. ? ? ?RADIOLOGY ? ?ED Provider Interpretation: I personally  reviewed these radiographs, oblique fracture along the distal fibula present based on my interpretation.  N/A. ? ?DG Ankle Complete Left ? ?Result Date: 12/13/2021 ?CLINICAL DATA:  Trauma to the left ankle. EXAM: LEFT FOOT - COMPLETE 3+ VIEW; LEFT ANKLE COMPLETE - 3+ VIEW COMPARISON:  None Available. FINDINGS: There is a minimally displaced oblique fracture of the distal fibula. No other acute fracture. The bones are well mineralized. No arthritic changes. The ankle mortise is intact. There is soft tissue swelling over the lateral ankle. No radiopaque foreign object or soft tissue gas. IMPRESSION: Minimally displaced oblique fracture of the distal fibula.  The Electronically Signed   By: Anner Crete M.D.   On: 12/13/2021 22:02  ? ?DG Foot Complete Left ? ?Result Date: 12/13/2021 ?CLINICAL DATA:  Trauma to the left ankle. EXAM: LEFT FOOT - COMPLETE 3+ VIEW; LEFT ANKLE COMPLETE - 3+ VIEW COMPARISON:  None Available. FINDINGS: There is a minimally displaced oblique fracture of the distal fibula. No other acute fracture. The bones are well mineralized. No arthritic changes. The ankle mortise is intact. There is soft tissue swelling over the lateral ankle. No radiopaque foreign object or soft tissue gas. IMPRESSION: Minimally displaced oblique fracture  of the distal fibula.  The Electronically Signed   By: Anner Crete M.D.   On: 12/13/2021 22:02   ? ?PROCEDURES: ? ?Critical Care performed: None. ? ?Procedures ? ? ? ?MEDICATIONS ORDERED IN ED: ?Medications  ?HYDROcodone-acetaminophen (NORCO/VICODIN) 5-325 MG per tablet 1 tablet (1 tablet Oral Given 12/13/21 2323)  ? ? ? ?IMPRESSION / MDM / ASSESSMENT AND PLAN / ED COURSE  ?I reviewed the triage vital signs and the nursing notes. ?             ?               ? ?Differential diagnosis includes, but is not limited to, tibia fracture, fibula fracture, medial/lateral malleolus fracture, tarsal fracture, metatarsal fracture, ankle sprain ? ?ED Course ?Patient appears well,  vitals within normal limits.  Currently endorsing mild-moderate pain.  We will go treat with hydrocodone/acetaminophen. ? ?Assessment/Plan ?Patient presents with ankle injury following mechanical fall while skating.  Found to have a oblique distal fibula fracture, minimal displacement.  Neurovascularly intact.  Low suspicion for any occult injuries warranting advanced imaging.  Placed a sugar-tong splint on the patient and provided crutches.  We will give this patient a referral to orthopedics for further evaluation and management.  Provided her with a short-term supply of hydrocodone/acetaminophen to be used as needed.  We will plan to discharge. ? ?Provided the patient with anticipatory guidance, return precautions, and educational material. Encouraged the patient to return to the emergency department at any time if they begin to experience any new or worsening symptoms. Patient expressed understanding and agreed with the plan.  ? ?  ? ? ?FINAL CLINICAL IMPRESSION(S) / ED DIAGNOSES  ? ?Final diagnoses:  ?Closed fracture of distal end of fibula, unspecified fracture morphology, initial encounter  ? ? ? ?Rx / DC Orders  ? ?ED Discharge Orders   ? ?      Ordered  ?  HYDROcodone-acetaminophen (NORCO/VICODIN) 5-325 MG tablet  Every 6 hours PRN       ? 12/13/21 2347  ? ?  ?  ? ?  ? ? ? ?Note:  This document was prepared using Dragon voice recognition software and may include unintentional dictation errors. ?  ?Teodoro Spray, [place] ?12/14/21 0040 ? ?  ?Blake Divine, MD ?12/14/21 2349 ? ?

## 2021-12-16 DIAGNOSIS — S82892A Other fracture of left lower leg, initial encounter for closed fracture: Secondary | ICD-10-CM | POA: Diagnosis not present

## 2021-12-23 DIAGNOSIS — S82892A Other fracture of left lower leg, initial encounter for closed fracture: Secondary | ICD-10-CM | POA: Diagnosis not present

## 2022-01-27 DIAGNOSIS — S82892A Other fracture of left lower leg, initial encounter for closed fracture: Secondary | ICD-10-CM | POA: Diagnosis not present

## 2022-05-07 ENCOUNTER — Emergency Department
Admission: EM | Admit: 2022-05-07 | Discharge: 2022-05-08 | Disposition: A | Discharge status: Other Acute Inpatient Hospital | Payer: Medicaid Other | Attending: Emergency Medicine | Admitting: Emergency Medicine

## 2022-05-07 ENCOUNTER — Other Ambulatory Visit: Payer: Self-pay

## 2022-05-07 DIAGNOSIS — F4010 Social phobia, unspecified: Secondary | ICD-10-CM | POA: Diagnosis present

## 2022-05-07 DIAGNOSIS — F509 Eating disorder, unspecified: Secondary | ICD-10-CM | POA: Insufficient documentation

## 2022-05-07 DIAGNOSIS — Z20822 Contact with and (suspected) exposure to covid-19: Secondary | ICD-10-CM | POA: Insufficient documentation

## 2022-05-07 DIAGNOSIS — F4011 Social phobia, generalized: Secondary | ICD-10-CM | POA: Insufficient documentation

## 2022-05-07 DIAGNOSIS — F332 Major depressive disorder, recurrent severe without psychotic features: Secondary | ICD-10-CM | POA: Insufficient documentation

## 2022-05-07 DIAGNOSIS — E039 Hypothyroidism, unspecified: Secondary | ICD-10-CM | POA: Diagnosis not present

## 2022-05-07 DIAGNOSIS — F1210 Cannabis abuse, uncomplicated: Secondary | ICD-10-CM | POA: Insufficient documentation

## 2022-05-07 DIAGNOSIS — F32A Depression, unspecified: Secondary | ICD-10-CM

## 2022-05-07 DIAGNOSIS — F331 Major depressive disorder, recurrent, moderate: Secondary | ICD-10-CM | POA: Diagnosis present

## 2022-05-07 LAB — CBC
HCT: 43.4 % (ref 36.0–46.0)
Hemoglobin: 14.3 g/dL (ref 12.0–15.0)
MCH: 33.5 pg (ref 26.0–34.0)
MCHC: 32.9 g/dL (ref 30.0–36.0)
MCV: 101.6 fL — ABNORMAL HIGH (ref 80.0–100.0)
Platelets: 250 10*3/uL (ref 150–400)
RBC: 4.27 MIL/uL (ref 3.87–5.11)
RDW: 12.5 % (ref 11.5–15.5)
WBC: 4.4 10*3/uL (ref 4.0–10.5)
nRBC: 0 % (ref 0.0–0.2)

## 2022-05-07 LAB — COMPREHENSIVE METABOLIC PANEL
ALT: 37 U/L (ref 0–44)
AST: 59 U/L — ABNORMAL HIGH (ref 15–41)
Albumin: 4.7 g/dL (ref 3.5–5.0)
Alkaline Phosphatase: 62 U/L (ref 38–126)
Anion gap: 13 (ref 5–15)
BUN: 6 mg/dL (ref 6–20)
CO2: 24 mmol/L (ref 22–32)
Calcium: 8.7 mg/dL — ABNORMAL LOW (ref 8.9–10.3)
Chloride: 106 mmol/L (ref 98–111)
Creatinine, Ser: 0.78 mg/dL (ref 0.44–1.00)
GFR, Estimated: >60 mL/min (ref >60)
Glucose, Bld: 114 mg/dL — ABNORMAL HIGH (ref 70–99)
Potassium: 3.6 mmol/L (ref 3.5–5.1)
Sodium: 143 mmol/L (ref 135–145)
Total Bilirubin: 0.7 mg/dL (ref 0.3–1.2)
Total Protein: 8.6 g/dL — ABNORMAL HIGH (ref 6.5–8.1)

## 2022-05-07 LAB — URINE DRUG SCREEN, QUALITATIVE (ARMC ONLY)
Amphetamines, Ur Screen: NOT DETECTED
Barbiturates, Ur Screen: NOT DETECTED
Benzodiazepine, Ur Scrn: NOT DETECTED
Cannabinoid 50 Ng, Ur ~~LOC~~: NOT DETECTED
Cocaine Metabolite,Ur ~~LOC~~: NOT DETECTED
MDMA (Ecstasy)Ur Screen: NOT DETECTED
Methadone Scn, Ur: NOT DETECTED
Opiate, Ur Screen: NOT DETECTED
Phencyclidine (PCP) Ur S: NOT DETECTED
Tricyclic, Ur Screen: NOT DETECTED

## 2022-05-07 LAB — RESP PANEL BY RT-PCR (FLU A&B, COVID) ARPGX2
Influenza A by PCR: NEGATIVE
Influenza B by PCR: NEGATIVE
SARS Coronavirus 2 by RT PCR: NEGATIVE

## 2022-05-07 LAB — POC URINE PREG, ED: Preg Test, Ur: NEGATIVE

## 2022-05-07 LAB — ETHANOL: Alcohol, Ethyl (B): 314 mg/dL (ref <10)

## 2022-05-07 MED ORDER — FOLIC ACID 1 MG PO TABS: 1.0000 mg | ORAL_TABLET | Freq: Every day | ORAL | Status: DC

## 2022-05-07 MED ORDER — THIAMINE HCL 100 MG/ML IJ SOLN: 100.0000 mg | Freq: Every day | INTRAMUSCULAR | Status: DC

## 2022-05-07 MED ORDER — LORAZEPAM 1 MG PO TABS
1.0000 mg | ORAL_TABLET | ORAL | Status: DC | PRN
  Administered 2022-05-07 – 2022-05-08 (×2): 2 mg via ORAL
  Filled 2022-05-07 (×2): qty 2

## 2022-05-07 MED ORDER — THIAMINE MONONITRATE 100 MG PO TABS: 100.0000 mg | ORAL_TABLET | Freq: Every day | ORAL | Status: DC

## 2022-05-07 MED ORDER — LORAZEPAM 2 MG/ML IJ SOLN: 1.0000 mg | INTRAMUSCULAR | Status: DC | PRN

## 2022-05-07 MED ORDER — ADULT MULTIVITAMIN W/MINERALS CH: 1.0000 | ORAL_TABLET | Freq: Every day | ORAL | Status: DC

## 2022-05-07 NOTE — ED Notes (Signed)
Vol /psych consult ordered/ pending  

## 2022-05-07 NOTE — ED Notes (Addendum)
Pt belongings:  1 teal hoodie 1 pair of gray pants 1 pair of black slides 1 cell phone 1 black shirt Loose change, black hair tie, gold nose ring, 2 bracelets

## 2022-05-07 NOTE — ED Triage Notes (Addendum)
Pt here with suicidal ideations. Pt states she has a lot going on in her life and feels like she needs help. Pt states her mother passed recently and she has had to move so she has a number of stressors. Pt is also currently intoxicated. Pt now denies that she is suicidal.

## 2022-05-07 NOTE — BH Assessment (Addendum)
Comprehensive Clinical Assessment (CCA) Note  05/07/2022 Diana Estrada [id number] Recommendations for Services/Supports/Treatments: Consulted with Lynder Parents., NP, who determined pt. meets inpatient psychiatric criteria Notified Dr. Cinda Quest and Maudie Mercury, RN of disposition recommendation.   Diana Estrada is a 21 year old, English speaking, Caucasian female with a PMH of MDD, recurrent, severe without psychosis, Social Anxiety, and an eating disorder.  Pt came in voluntarily. Per triage note: Pt here with suicidal ideations. Pt states she has a lot going on in her life and feels like she needs help. Pt states her mother passed recently and she has had to move so she has a number of stressors.  Upon assessment pt. was sitting in bed crying uncontrollably. Pt explained that she'd presented to the hospital due to having a lot going on. Pt identified her main stressors as her mother passing away in February due to alcoholism, her job, and custodial issues with her children's father. Pt had good insight around her emotional lability and her drinking behavior. Pt admitted that she is often overwhelmed and has an inability to re-regulate, resorting to drinking excessively when stressed/or triggered. Pt had poor concentration as her anxiety interfered. Pt reported that she drinks daily in order to function. Pt explained that she'd stayed up all night drinking reporting that she drank 2 bottles of liquor and beer. Pt reported that she has withdrawals if she does not drink including sweats, shaking, palpitations, poor concentration and gasping for air. Pt was in the action stage of change, requesting detox and/or inpatient treatment.  Pt had slurred speech. Pt was oriented x3. Pt had depressed mood and a sad affect. The pt. had adequate reality testing. The pt. did not appear to be responding to internal/external stimuli nor did she present with any delusional thinking. The admitted that she'd had thoughts of SI but denied  current SI, HI or AV/H. Pt's BAL is 314. Chief Complaint:  Chief Complaint  Patient presents with   Suicidal   Visit Diagnosis: MDD, recurrent, severe without psychosis Alcohol abuse    CCA Screening, Triage and Referral (STR)  Patient Reported Information How did you hear about [place]? Self  Referral name: No data recorded Referral phone number: No data recorded  Whom do you see for routine medical problems? No data recorded Practice/Facility Name: No data recorded Practice/Facility Phone Number: No data recorded Name of Contact: No data recorded Contact Number: No data recorded Contact Fax Number: No data recorded Prescriber Name: No data recorded Prescriber Address (if known): No data recorded  What Is the Reason for Your Visit/Call Today? Pt here with suicidal ideations. Pt states she has a lot going on in her life and feels like she needs help. Pt states her mother passed recently and she has had to move so she has a number of stressors.  How Long Has This Been Causing You Problems? > than 6 months  What Do You Feel Would Help You the Most Today? Treatment for Depression or other mood problem; Medication(s); Stress Management   Have You Recently Been in Any Inpatient Treatment (Hospital/Detox/Crisis Center/28-Day Program)? No data recorded Name/Location of Program/Hospital:No data recorded How Long Were You There? No data recorded When Were You Discharged? No data recorded  Have You Ever Received Services From [facility] Before? No data recorded Who Do You See at [facility]? No data recorded  Have You Recently Had Any Thoughts About Hurting Yourself? Yes  Are You Planning to Commit Suicide/Harm Yourself At This time? No   Have you  Recently Had Thoughts About Hurting Someone Karolee Ohs? No  Explanation: No data recorded  Have You Used Any Alcohol or Drugs in the Past 24 Hours? Yes  How Long Ago Did You Use Drugs or Alcohol? No data recorded What Did You Use and How  Much? An unknown amount of liquor and beer   Do You Currently Have a Therapist/Psychiatrist? No  Name of Therapist/Psychiatrist: n/a   Have You Been Recently Discharged From Any Office Practice or Programs? No  Explanation of Discharge From Practice/Program: No data recorded    CCA Screening Triage Referral Assessment Type of Contact: Face-to-Face  Is this Initial or Reassessment? No data recorded Date Telepsych consult ordered in [facility]:  No data recorded Time Telepsych consult ordered in [facility]:  No data recorded  Patient Reported Information Reviewed? No data recorded Patient Left Without Being Seen? No data recorded Reason for Not Completing Assessment: No data recorded  Collateral Involvement: None provided   Does Patient Have a Court Appointed Legal Guardian? No data recorded Name and Contact of Legal Guardian: No data recorded If Minor and Not Living with Parent(s), Who has Custody? No data recorded Is CPS involved or ever been involved? Never  Is APS involved or ever been involved? Never   Patient Determined To Be At Risk for Harm To Self or Others Based on Review of Patient Reported Information or Presenting Complaint? Yes, for Self-Harm  Method: No data recorded Availability of Means: No data recorded Intent: No data recorded Notification Required: No data recorded Additional Information for Danger to Others Potential: No data recorded Additional Comments for Danger to Others Potential: No data recorded Are There Guns or Other Weapons in Your Home? No data recorded Types of Guns/Weapons: No data recorded Are These Weapons Safely Secured?                            No data recorded Who Could Verify You Are Able To Have These Secured: No data recorded Do You Have any Outstanding Charges, Pending Court Dates, Parole/Probation? No data recorded Contacted To Inform of Risk of Harm To Self or Others: Other: Comment   Location of Assessment: [facility] ED   Does Patient  Present under Involuntary Commitment? No  IVC Papers Initial File Date: No data recorded  [place] of Residence: [place]   Patient Currently Receiving the Following Services: Not Receiving Services   Determination of Need: Emergent (2 hours)   Options For Referral: Inpatient Hospitalization     CCA Biopsychosocial Intake/Chief Complaint:  No data recorded Current Symptoms/Problems: No data recorded  Patient Reported Schizophrenia/Schizoaffective Diagnosis in Past: No   Strengths: Pt is motivated for change; pt is receptive to treatment; Pt is employed  Preferences: No data recorded Abilities: No data recorded  Type of Services Patient Feels are Needed: No data recorded  Initial Clinical Notes/Concerns: No data recorded  Mental Health Symptoms Depression:   Worthlessness; Hopelessness; Sleep (too much or little); Tearfulness; Difficulty Concentrating   Duration of Depressive symptoms:  Greater than two weeks   Mania:   Racing thoughts   Anxiety:    Worrying; Tension; Sleep; Restlessness   Psychosis:   None   Duration of Psychotic symptoms: No data recorded  Trauma:   Hypervigilance; Guilt/shame; Emotional numbing; Difficulty staying/falling asleep   Obsessions:   Cause anxiety; Recurrent & persistent thoughts/impulses/images; Attempts to suppress/neutralize; Disrupts routine/functioning; Good insight; Intrusive/time consuming   Compulsions:   Disrupts with routine/functioning; "Driven" to perform  behaviors/acts; Good insight; Intended to reduce stress or prevent another outcome; Intrusive/time consuming; Repeated behaviors/mental acts   Inattention:   None   Hyperactivity/Impulsivity:   None   Oppositional/Defiant Behaviors:   None   Emotional Irregularity:   Mood lability; Unstable self-image   Other Mood/Personality Symptoms:  No data recorded   Mental Status Exam Appearance and self-care  Stature:   Average   Weight:   Overweight    Clothing:   -- (In scrubs)   Grooming:   Normal   Cosmetic use:   None   Posture/gait:   Slumped   Motor activity:   Restless   Sensorium  Attention:   Normal   Concentration:   Anxiety interferes   Orientation:   Object; Person; Place; Situation   Recall/memory:   Normal   Affect and Mood  Affect:   Tearful   Mood:   Dysphoric   Relating  Eye contact:   None   Facial expression:   Sad   Attitude toward examiner:   Cooperative   Thought and Language  Speech flow:  Clear and Coherent   Thought content:   Appropriate to Mood and Circumstances   Preoccupation:   Ruminations   Hallucinations:   None   Organization:  No data recorded  [employer] of Knowledge:   Average   Intelligence:   Average   Abstraction:   Normal   Judgement:   Impaired   Reality Testing:   Adequate   Insight:   Good   Decision Making:   Vacilates   Social Functioning  Social Maturity:   Isolates   Social Judgement:   Normal   Stress  Stressors:   Grief/losses; Work   Coping Ability:   Human resources officer Deficits:   Self-care   Supports:   Family; Support needed     Religion: Religion/Spirituality Are You A Religious Person?: No  Leisure/Recreation: Leisure / Recreation Do You Have Hobbies?: No  Exercise/Diet: Exercise/Diet Do You Exercise?: No Have You Gained or Lost A Significant Amount of Weight in the Past Six Months?: No Do You Follow a Special Diet?: No Do You Have Any Trouble Sleeping?: Yes Explanation of Sleeping Difficulties: Pt reported that she stays up all night drinking.   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 Patient's Job has Been Impacted by Current Illness: No Has Patient ever Been in the [employer]?: No  Education: Education Is  Patient Currently Attending School?: No Did You Product manager?: No Did You Have An Individualized Education Program (IIEP): No Did You Have Any Difficulty At School?: No Patient's Education Has Been Impacted by Current Illness: No   CCA Family/Childhood History Family and Relationship History: Family history Marital status: Single Does patient have children?: Yes How many children?: 2 How is patient's relationship with their children?: Pt reported that her relationship with her children are good enough.  Childhood History:  Childhood History By whom was/is the patient raised?: Mother Did patient suffer any verbal/emotional/physical/sexual abuse as a child?: No Did patient suffer from severe childhood neglect?: No Has patient ever been sexually abused/assaulted/raped as an adolescent or adult?: No Was the patient ever a victim of a crime or a disaster?: No Witnessed domestic violence?: No Has patient been affected by domestic violence as an adult?: No  Child/Adolescent Assessment:     CCA Substance Use Alcohol/Drug Use: Alcohol /  Drug Use Pain Medications: See MAR Prescriptions: See MAR Over the Counter: See MAR History of alcohol / drug use?: Yes Longest period of sobriety (when/how long): Unknown Negative Consequences of Use: Personal relationships Withdrawal Symptoms: Tremors, Agitation, Sweats, Patient aware of relationship between substance abuse and physical/medical complications, Tachycardia Substance #1 Name of Substance 1: Alcohol 1 - Age of First Use: Unknown 1 - Amount (size/oz): "Sometimes I drink alot; sometimes I drink a beer 1 - Frequency: Daily 1 - Duration: Since February 2023 1 - Last Use / Amount: 05/07/22                       ASAM's:  Six Dimensions of Multidimensional Assessment  Dimension 1:  Acute Intoxication and/or Withdrawal Potential:   Dimension 1:  Description of individual's past and current experiences of substance use and  withdrawal: Pt has a hx of severe ETOH abuse; pt has significant withdrawal symptoms  Dimension 2:  Biomedical Conditions and Complications:      Dimension 3:  Emotional, Behavioral, or Cognitive Conditions and Complications:  Dimension 3:  Description of emotional, behavioral, or cognitive conditions and complications: Pt becomes depressed  Dimension 4:  Readiness to Change:     Dimension 5:  Relapse, Continued use, or Continued Problem Potential:     Dimension 6:  Recovery/Living Environment:     ASAM Severity Score: ASAM's Severity Rating Score: 15  ASAM Recommended Level of Treatment: ASAM Recommended Level of Treatment: Level III Residential Treatment   Substance use Disorder (SUD) Substance Use Disorder (SUD)  Checklist Symptoms of Substance Use: Continued use despite having a persistent/recurrent physical/psychological problem caused/exacerbated by use, Continued use despite persistent or recurrent social, interpersonal problems, caused or exacerbated by use, Evidence of tolerance, Evidence of withdrawal (Comment), Persistent desire or unsuccessful efforts to cut down or control use, Presence of craving or strong urge to use, Substance(s) often taken in larger amounts or over longer times than was intended  Recommendations for Services/Supports/Treatments: Recommendations for Services/Supports/Treatments Recommendations For Services/Supports/Treatments: Detox, Inpatient Hospitalization  DSM5 Diagnoses: Patient Active Problem List   Diagnosis Date Noted   Excess weight gain in pregnancy, third trimester 05/13/2019   Increased body mass index (BMI) 12/16/2018   History of recurrent UTIs 07/30/2016   Eating disorder, unspecified 08/29/2013   Cannabis abuse 08/29/2013   MDD (major depressive disorder), recurrent severe, without psychosis (HCC) 08/27/2013   Social anxiety disorder 08/27/2013   Vickey Boak R Jabarri Stefanelli, LCAS

## 2022-05-07 NOTE — ED Notes (Signed)
Patient alert and oriented x4. Patient states having some SI thoughts without a plan. Patient states she has been drinking pretty heavily for the past month and states when she does not drink she starts to have withdraw symptoms.  Patient is tearful during assessment. Patient denies HI Per patient when she starts to withdraw from Etoh she has AVH but currently not having any.    ENVIRONMENTAL ASSESSMENT Potentially harmful objects out of patient reach: Yes.   Personal belongings secured: Yes.   Patient dressed in hospital provided attire only: Yes.   Plastic bags out of patient reach: Yes.   Patient care equipment (cords, cables, call bells, lines, and drains) shortened, removed, or accounted for: Yes.   Equipment and supplies removed from bottom of stretcher: Yes.   Potentially toxic materials out of patient reach: Yes.   Sharps container removed or out of patient reach: Yes.

## 2022-05-07 NOTE — ED Provider Notes (Signed)
[facility] Provider Note   Event Date/Time   First MD Initiated Contact with Patient 05/07/22 1411     (approximate) History  Suicidal  HPI Diana Estrada is a 21 y.o. female with a stated past medical history of depression who has not been on any psychopharmaceutical's for at least a year who presents for worsening depressive symptoms as well as alcohol abuse.  Patient states that she does drink every day and has noticed significant withdrawal symptoms when she tries to stop drinking. ROS: Patient currently denies any vision changes, tinnitus, difficulty speaking, facial droop, sore throat, chest pain, shortness of breath, abdominal pain, nausea/vomiting/diarrhea, dysuria, or weakness/numbness/paresthesias in any extremity   Physical Exam  Triage Vital Signs: ED Triage Vitals  Enc Vitals Group     BP 05/07/22 1400 (!) 142/96     Pulse Rate 05/07/22 1400 (!) 115     Resp 05/07/22 1400 18     Temp 05/07/22 1400 98.5 F (36.9 C)     Temp Source 05/07/22 1400 Oral     SpO2 05/07/22 1400 100 %     Weight 05/07/22 1401 240 lb 1.3 oz (108.9 kg)     Height 05/07/22 1401 5\' 8"  (1.727 m)     Head Circumference --      Peak Flow --      Pain Score 05/07/22 1400 10     Pain Loc --      Pain Edu? --      Excl. in [place]? --    Most recent vital signs: Vitals:   05/07/22 1400  BP: (!) 142/96  Pulse: (!) 115  Resp: 18  Temp: 98.5 F (36.9 C)  SpO2: 100%   General: Awake, oriented x4. CV:  Good peripheral perfusion.  Resp:  Normal effort.  Abd:  No distention.  Other:  Tearful young adult Hispanic female sitting on stretcher in no acute distress ED Results / Procedures / Treatments  Labs (all labs ordered are listed, but only abnormal results are displayed) Labs Reviewed  COMPREHENSIVE METABOLIC PANEL - Abnormal; Notable for the following components:      Result Value   Glucose, Bld 114 (*)    Calcium 8.7 (*)    Total Protein 8.6 (*)    AST 59 (*)    All  other components within normal limits  ETHANOL - Abnormal; Notable for the following components:   Alcohol, Ethyl (B) 314 (*)    All other components within normal limits  CBC - Abnormal; Notable for the following components:   MCV 101.6 (*)    All other components within normal limits  RESP PANEL BY RT-PCR (FLU A&B, COVID) [id number]  URINE DRUG SCREEN, QUALITATIVE ([facility] ONLY)  POC URINE PREG, ED  PROCEDURES: Critical Care performed: No Procedures MEDICATIONS ORDERED IN ED: Medications - No data to display IMPRESSION / MDM / [place] / ED COURSE  I reviewed the triage vital signs and the nursing notes.                             The patient is on the cardiac monitor to evaluate for evidence of arrhythmia and/or significant heart rate changes Patient's presentation is most consistent with acute presentation with potential threat to life or bodily function. Thoughts are linear and organized, and patient has no AH, VH, or HI. Prior suicide attempt: Denies Prior Psychiatric Hospitalizations: States she was hospitalized when she was  14  Clinically patient displays no overt toxidrome; they are well appearing, with low suspicion for toxic ingestion given history and exam. Thoughts unlikely 2/2 anemia, hypothyroidism, infection, or ICH.  Consult: Psychiatry to evaluate patient for potential hold for danger to self. Disposition: Care of this patient will be signed out to the oncoming physician at the end of my shift.  All pertinent patient information conveyed and all questions answered.  All further care and disposition decisions will be made by the oncoming physician.     FINAL CLINICAL IMPRESSION(S) / ED DIAGNOSES   Final diagnoses:  Depression, unspecified depression type   Rx / DC Orders   ED Discharge Orders     None      Note:  This document was prepared using Dragon voice recognition software and may include unintentional dictation errors.   Merwyn Katos,  MD 05/07/22 [phone]

## 2022-05-07 NOTE — Consult Note (Incomplete)
[facility] Face-to[facility]Face [facility] Consult   Reason for Consult:Suicidal Referring Physician: Dr. Vicente MalesBradler Patient Identification: Diana RasmussenCelia Estrada MRN:  [id number] Principal Diagnosis: <principal problem not specified> Diagnosis:  Active Problems:   MDD (major depressive disorder), recurrent severe, without psychosis (HCC)   Social anxiety disorder   Eating disorder, unspecified   Cannabis abuse   Total Time spent with patient: 1 hour  Subjective: "I have a lot going on." Diana RasmussenCelia Estrada is a 21 y.o. female patient [facility] voluntarily due to suicidal ideation without a plan. The patient was seen sitting on her bed very emotional. The patient is stating "I am going thorugh a lot. "I need  The patient was seen face-to-face by this provider; chart reviewed and consulted with Dr. ED providers and Dr. Jenel LucksXXXX who ever doc on call on 09/00/2023 due to the care of the patient. It was discussed with both providers that the patient does/does not meet criteria to be admitted to the inpatient unit.  On evaluation the patient reports....  During his/her evaluation, @NAME  @ is alert and oriented x4, calm and cooperative, and mood-congruent with affect.  The patient does not appear to be responding to internal or external stimuli. Neither is the patient presenting with any delusional thinking. The patient denies auditory or visual hallucinations. The patient denies any suicidal, homicidal, or self-harm ideations. The patient is not presenting with any psychotic or paranoid behaviors. During an encounter with the patient, he/she was able to answer questions appropriately.  HPI: Per Dr. Vicente MalesBradler, Diana RasmussenCelia Estrada is a 21 y.o. female with a stated past medical history of depression who has not been on any psychopharmaceutical's for at least a year who presents for worsening depressive symptoms as well as alcohol abuse.  Patient states that she does drink every day and has noticed significant withdrawal symptoms when  she tries to stop drinking. ROS: Patient currently denies any vision changes, tinnitus, difficulty speaking, facial droop, sore throat, chest pain, shortness of breath, abdominal pain, nausea/vomiting/diarrhea, dysuria, or weakness/numbness/paresthesias in any extremity  Past Psychiatric History: History reviewed. No pertinent past psychiatric history  Risk to Self:   Risk to Others:   Prior Inpatient Therapy:   Prior Outpatient Therapy:    Past Medical History:  Past Medical History:  Diagnosis Date  . History of recurrent UTIs 07/30/2016  . Medical history non-contributory     Past Surgical History:  Procedure Laterality Date  . NO PAST SURGERIES     Family History:  Family History  Problem Relation Age of Onset  . Healthy Mother    Family Psychiatric  History: Mother-alcohol used disorder Social History:  Social History   Substance and Sexual Activity  Alcohol Use Yes   Comment: occasionally     Social History   Substance and Sexual Activity  Drug Use Not Currently  . Types: Marijuana   Comment: last used @ 21 yrs old     Social History   Socioeconomic History  . Marital status: Single    Spouse name: Not on file  . Number of children: Not on file  . Years of education: Not on file  . Highest education level: Not on file  Occupational History  . Not on file  Tobacco Use  . Smoking status: Former    Types: Cigarettes    Quit date: 08/11/2014    Years since quitting: 7.7  . Smokeless tobacco: Never  Vaping Use  . Vaping Use: Every day  . Start date: 09/05/2014  . Substances:  Nicotine, Flavoring  Substance and Sexual Activity  . Alcohol use: Yes    Comment: occasionally  . Drug use: Not Currently    Types: Marijuana    Comment: last used @ 21 yrs old   . Sexual activity: Yes    Birth control/protection: I.U.D.    Comment: IUD Paragard inserted 08/30/2019  Other Topics Concern  . Not on file  Social History Narrative  . Not on file   Social  Determinants of Health   Financial Resource Strain: Not on file  Food Insecurity: Not on file  Transportation Needs: Not on file  Physical Activity: Not on file  Stress: Not on file  Social Connections: Not on file   Additional Social History:    Allergies:  No Known Allergies  Labs:  Results for orders placed or performed during the hospital encounter of 05/07/22 (from the past 48 hour(s))  Comprehensive metabolic panel     Status: Abnormal   Collection Time: 05/07/22  2:03 PM  Result Value Ref Range   Sodium 143 135 - 145 mmol/L   Potassium 3.6 3.5 - 5.1 mmol/L   Chloride 106 98 - 111 mmol/L   CO2 24 22 - 32 mmol/L   Glucose, Bld 114 (H) 70 - 99 mg/dL    Comment: Glucose reference range applies only to samples taken after fasting for at least 8 hours.   BUN 6 6 - 20 mg/dL   Creatinine, Ser 0.78 0.44 - 1.00 mg/dL   Calcium 8.7 (L) 8.9 - 10.3 mg/dL   Total Protein 8.6 (H) 6.5 - 8.1 g/dL   Albumin 4.7 3.5 - 5.0 g/dL   AST 59 (H) 15 - 41 U/L   ALT 37 0 - 44 U/L   Alkaline Phosphatase 62 38 - 126 U/L   Total Bilirubin 0.7 0.3 - 1.2 mg/dL   GFR, Estimated >60 >60 mL/min    Comment: (NOTE) Calculated using the CKD-EPI Creatinine Equation (2021)    Anion gap 13 5 - 15    Comment: Performed at [facility], [address] [place], [place] [address]  Ethanol     Status: Abnormal   Collection Time: 05/07/22  2:03 PM  Result Value Ref Range   Alcohol, Ethyl (B) 314 (HH) <10 mg/dL    Comment: CRITICAL RESULT CALLED TO, READ BACK BY AND VERIFIED WITH ANNIE SMITH 05/07/22 1524 KLW (NOTE) Lowest detectable limit for serum alcohol is 10 mg/dL.  For medical purposes only. Performed at [facility], [address] [place], [place] [address]   cbc     Status: Abnormal   Collection Time: 05/07/22  2:03 PM  Result Value Ref Range   WBC 4.4 4.0 - 10.5 K/uL   RBC 4.27 3.87 - 5.11 MIL/uL   Hemoglobin 14.3 12.0 - 15.0 g/dL   HCT 43.4 36.0 - 46.0 %   MCV 101.6  (H) 80.0 - 100.0 fL   MCH 33.5 26.0 - 34.0 pg   MCHC 32.9 30.0 - 36.0 g/dL   RDW 12.5 11.5 - 15.5 %   Platelets 250 150 - 400 K/uL   nRBC 0.0 0.0 - 0.2 %    Comment: Performed at [facility], [address] [place], [place] [address]  Urine Drug Screen, Qualitative     Status: None   Collection Time: 05/07/22  2:03 PM  Result Value Ref Range   Tricyclic, Ur Screen NONE DETECTED NONE DETECTED   Amphetamines, Ur Screen NONE DETECTED NONE DETECTED  MDMA (Ecstasy)Ur Screen NONE DETECTED NONE DETECTED   Cocaine Metabolite,Ur [place] NONE DETECTED NONE DETECTED   Opiate, Ur Screen NONE DETECTED NONE DETECTED   Phencyclidine (PCP) Ur S NONE DETECTED NONE DETECTED   Cannabinoid 50 Ng, Ur [place] NONE DETECTED NONE DETECTED   Barbiturates, Ur Screen NONE DETECTED NONE DETECTED   Benzodiazepine, Ur Scrn NONE DETECTED NONE DETECTED   Methadone Scn, Ur NONE DETECTED NONE DETECTED    Comment: (NOTE) Tricyclics + metabolites, urine    Cutoff 1000 ng/mL Amphetamines + metabolites, urine  Cutoff 1000 ng/mL MDMA (Ecstasy), urine              Cutoff 500 ng/mL Cocaine Metabolite, urine          Cutoff 300 ng/mL Opiate + metabolites, urine        Cutoff 300 ng/mL Phencyclidine (PCP), urine         Cutoff 25 ng/mL Cannabinoid, urine                 Cutoff 50 ng/mL Barbiturates + metabolites, urine  Cutoff 200 ng/mL Benzodiazepine, urine              Cutoff 200 ng/mL Methadone, urine                   Cutoff 300 ng/mL  The urine drug screen provides only a preliminary, unconfirmed analytical test result and should not be used for non-medical purposes. Clinical consideration and professional judgment should be applied to any positive drug screen result due to possible interfering substances. A more specific alternate chemical method must be used in order to obtain a confirmed analytical result. Gas chromatography / mass spectrometry (GC/MS) is the preferred confirm atory method. Performed at  [facility], [address] [place], [place] [address]   POC urine preg, ED     Status: None   Collection Time: 05/07/22  2:33 PM  Result Value Ref Range   Preg Test, Ur NEGATIVE NEGATIVE    Comment:        THE SENSITIVITY OF THIS METHODOLOGY IS >24 mIU/mL   Resp Panel by RT-PCR (Flu A&B, Covid) Anterior Nasal Swab     Status: None   Collection Time: 05/07/22  5:18 PM   Specimen: Anterior Nasal Swab  Result Value Ref Range   SARS Coronavirus 2 by RT PCR NEGATIVE NEGATIVE    Comment: (NOTE) SARS-CoV-2 target nucleic acids are NOT DETECTED.  The SARS-CoV-2 RNA is generally detectable in upper respiratory specimens during the acute phase of infection. The lowest concentration of SARS-CoV-2 viral copies this assay can detect is 138 copies/mL. A negative result does not preclude SARS-Cov-2 infection and should not be used as the sole basis for treatment or other patient management decisions. A negative result may occur with  improper specimen collection/handling, submission of specimen other than nasopharyngeal swab, presence of viral mutation(s) within the areas targeted by this assay, and inadequate number of viral copies(<138 copies/mL). A negative result must be combined with clinical observations, patient history, and epidemiological information. The expected result is Negative.  Fact Sheet for Patients:  BloggerCourse.com  Fact Sheet for Healthcare Providers:  SeriousBroker.it  This test is no t yet approved or cleared by the [place] FDA and  has been authorized for detection and/or diagnosis of SARS-CoV-2 by FDA under an Emergency Use Authorization (EUA). This EUA will remain  in effect (meaning this test can be used) for the duration of the COVID-19 declaration under Section  564(b)(1) of the Act, 21 U.S.C.section 360bbb-3(b)(1), unless the authorization is terminated  or revoked sooner.        Influenza A by PCR NEGATIVE NEGATIVE   Influenza B by PCR NEGATIVE NEGATIVE    Comment: (NOTE) The Xpert Xpress SARS-CoV-2/FLU/RSV plus assay is intended as an aid in the diagnosis of influenza from Nasopharyngeal swab specimens and should not be used as a sole basis for treatment. Nasal washings and aspirates are unacceptable for Xpert Xpress SARS-CoV-2/FLU/RSV testing.  Fact Sheet for Patients: BloggerCourse.com  Fact Sheet for Healthcare Providers: SeriousBroker.it  This test is not yet approved or cleared by the [place] FDA and has been authorized for detection and/or diagnosis of SARS-CoV-2 by FDA under an Emergency Use Authorization (EUA). This EUA will remain in effect (meaning this test can be used) for the duration of the COVID-19 declaration under Section 564(b)(1) of the Act, 21 U.S.C. section 360bbb-3(b)(1), unless the authorization is terminated or revoked.  Performed at [facility], [address] [place], [place] [address]     Current Facility-Administered Medications  Medication Dose Route Frequency Provider Last Rate Last Admin  . [START ON 05/08/2022] folic acid (FOLVITE) tablet 1 mg  1 mg Oral Daily Gillermo Murdoch, NP      . LORazepam (ATIVAN) tablet 1-4 mg  1-4 mg Oral Q1H PRN Gillermo Murdoch, NP   2 mg at 05/07/22 2247   Or  . LORazepam (ATIVAN) injection 1-4 mg  1-4 mg Intravenous Q1H PRN Gillermo Murdoch, NP      . Melene Muller ON 05/08/2022] multivitamin with minerals tablet 1 tablet  1 tablet Oral Daily Gillermo Murdoch, NP      . Melene Muller ON 05/08/2022] thiamine (VITAMIN B1) tablet 100 mg  100 mg Oral Daily Gillermo Murdoch, NP       Or  . Melene Muller ON 05/08/2022] thiamine (VITAMIN B1) injection 100 mg  100 mg Intravenous Daily Gillermo Murdoch, NP       Current Outpatient Medications  Medication Sig Dispense Refill  . fluconazole (DIFLUCAN) 150 MG tablet Take 150 mg by  mouth every 3 (three) days. (Patient not taking: Reported on 05/07/2022)    . metroNIDAZOLE (FLAGYL) 500 MG tablet Take 1 tablet (500 mg total) by mouth 2 (two) times daily. (Patient not taking: Reported on 05/07/2022) 14 tablet 0  . PARAGARD INTRAUTERINE COPPER IU by Intrauterine route. Inserted 08/30/2019    . Probiotic Product (PROBIOTIC DAILY PO) Take by mouth.      Musculoskeletal: Strength & Muscle Tone: within normal limits Gait & Station: normal Patient leans: N/A  Psychiatric Specialty Exam:  Presentation  General Appearance: No data recorded Eye Contact:No data recorded Speech:No data recorded Speech Volume:No data recorded Handedness:No data recorded  Mood and Affect  Mood:Depressed; Hopeless  Affect:Tearful; Depressed; Flat; Blunt   Thought Process  Thought Processes:Coherent  Descriptions of Associations:Intact  Orientation:Full (Time, Place and Person)  Thought Content:Logical  History of Schizophrenia/Schizoaffective disorder:No  Duration of Psychotic Symptoms:No data recorded Hallucinations:Hallucinations: None  Ideas of Reference:None  Suicidal Thoughts:Suicidal Thoughts: Yes, Passive SI Passive Intent and/or Plan: Without Intent; Without Plan  Homicidal Thoughts:Homicidal Thoughts: No   Sensorium  Memory:Immediate Good; Recent Good; Remote Good  Judgment:Intact  Insight:Good   Executive Functions  Concentration:Fair  Attention Span:Fair  Recall:Good  Fund of Knowledge:Good  Language:Good   Psychomotor Activity  Psychomotor Activity:Psychomotor Activity: Normal   Assets  Assets:Communication Skills; Desire for Improvement; Resilience; Social Support   Sleep  Sleep:Sleep: Fair   Physical Exam:  Physical Exam Vitals and nursing note reviewed.  Constitutional:      Appearance: Normal appearance. She is normal weight.  HENT:     Head: Normocephalic and atraumatic.     Right Ear: External ear normal.     Left Ear:  External ear normal.     Nose: Nose normal.     Mouth/Throat:     Mouth: Mucous membranes are moist.  Cardiovascular:     Rate and Rhythm: Normal rate.     Pulses: Normal pulses.  Pulmonary:     Effort: Pulmonary effort is normal.  Musculoskeletal:        General: Normal range of motion.     Cervical back: Normal range of motion and neck supple.  Neurological:     General: No focal deficit present.     Mental Status: She is alert and oriented to person, place, and time.  Psychiatric:        Attention and Perception: Attention normal. She perceives visual hallucinations.        Mood and Affect: Mood is anxious and depressed. Affect is blunt and tearful.        Speech: Speech is tangential.        Behavior: Behavior is cooperative.        Thought Content: Thought content includes suicidal ideation.        Cognition and Memory: Cognition and memory normal.        Judgment: Judgment is inappropriate.    Review of Systems  Psychiatric/Behavioral:  Positive for depression, hallucinations, substance abuse and suicidal ideas. The patient is nervous/anxious.   All other systems reviewed and are negative.  Blood pressure (!) 141/79, pulse 98, temperature 98.7 F (37.1 C), temperature source Oral, resp. rate 16, height 5\' 8"  (1.727 m), weight 108.9 kg, last menstrual period 05/04/2022, SpO2 98 %. Body mass index is 36.5 kg/m.  Treatment Plan Summary: Medication management and Plan Patient does meet criteria for psychiatric inpatient admission and will be put on CIWA protocol.  Disposition: Recommend psychiatric Inpatient admission when medically cleared. Supportive therapy provided about ongoing stressors.  05/06/2022, NP 05/07/2022 11:42 PM

## 2022-05-07 NOTE — Consult Note (Signed)
[facility] Face-to-Face Psychiatry Consult   Reason for Consult:Suicidal Referring Physician: Dr. Vicente Estrada Patient Identification: Diana Estrada MRN:  [id number] Principal Diagnosis: <principal problem not specified> Diagnosis:  Active Problems:   MDD (major depressive disorder), recurrent severe, without psychosis (HCC)   Social anxiety disorder   Eating disorder, unspecified   Cannabis abuse   Total Time spent with patient: 1 hour  Subjective: "I have a lot going on." Diana Estrada is a 21 y.o. female patient presented to  [facility] ED voluntarily due to suicidal ideation without a plan. The patient was seen sitting on her bed, very emotional. The patient is stating, "I am going through a lot." "I need help." The patient came in with a BAL of 314 mg/dl and UDS unremarkable. The patient voiced her depression and shared that she has passive suicidal ideations. The patient shared her main stressors, such as her mother passing away in February due to alcoholism, her job, and custodial issues with her children's father. She had good insight into her emotional lability and her drinking behavior. Pt admitted that she is often overwhelmed and cannot re-regulate, resorting to drinking excessively when stressed/or triggered.  This provider saw The patient face-to-face; the chart was reviewed, and consulted with Dr. Darnelle Estrada on 05/07/2022 due to the patient's care. It was discussed with the EDP that the patient does meet the criteria to be admitted to the psychiatric inpatient unit.  On evaluation, the patient is alert and oriented x 4, emotional but cooperative, and mood-congruent with affect. The patient is receptive to psychiatric inpatient treatment. The patient does appear to be responding to internal and external stimuli. The patient is not presenting with any delusional thinking. The patient denies auditory but admits to visual hallucinations when she has overly consumed alcohol. The patient admits to suicidal  ideation but homicidal or self-harm ideations. The patient is not presenting with any psychotic or paranoid behaviors. During an encounter with the patient, she could answer questions appropriately.  HPI: Per Dr. Vicente Estrada, Diana Estrada is a 21 y.o. female with a stated past medical history of depression who has not been on any psychopharmaceutical's for at least a year who presents for worsening depressive symptoms as well as alcohol abuse.  Patient states that she does drink every day and has noticed significant withdrawal symptoms when she tries to stop drinking. ROS: Patient currently denies any vision changes, tinnitus, difficulty speaking, facial droop, sore throat, chest pain, shortness of breath, abdominal pain, nausea/vomiting/diarrhea, dysuria, or weakness/numbness/paresthesias in any extremity  Past Psychiatric History: History reviewed. No pertinent past psychiatric history  Risk to Self:   Risk to Others:   Prior Inpatient Therapy:   Prior Outpatient Therapy:    Past Medical History:  Past Medical History:  Diagnosis Date   History of recurrent UTIs 07/30/2016   Medical history non-contributory     Past Surgical History:  Procedure Laterality Date   NO PAST SURGERIES     Family History:  Family History  Problem Relation Age of Onset   Healthy Mother    Family Psychiatric  History: Mother-alcohol used disorder Social History:  Social History   Substance and Sexual Activity  Alcohol Use Yes   Comment: occasionally     Social History   Substance and Sexual Activity  Drug Use Not Currently   Types: Marijuana   Comment: last used @ 21 yrs old     Social History   Socioeconomic History   Marital status: Single    Spouse name: Not on  file   Number of children: Not on file   Years of education: Not on file   Highest education level: Not on file  Occupational History   Not on file  Tobacco Use   Smoking status: Former    Types: Cigarettes    Quit date:  08/11/2014    Years since quitting: 7.7   Smokeless tobacco: Never  Vaping Use   Vaping Use: Every day   Start date: 09/05/2014   Substances: Nicotine, Flavoring  Substance and Sexual Activity   Alcohol use: Yes    Comment: occasionally   Drug use: Not Currently    Types: Marijuana    Comment: last used @ 21 yrs old    Sexual activity: Yes    Birth control/protection: I.U.D.    Comment: IUD Paragard inserted 08/30/2019  Other Topics Concern   Not on file  Social History Narrative   Not on file   Social Determinants of Health   Financial Resource Strain: Not on file  Food Insecurity: Not on file  Transportation Needs: Not on file  Physical Activity: Not on file  Stress: Not on file  Social Connections: Not on file   Additional Social History:    Allergies:  No Known Allergies  Labs:  Results for orders placed or performed during the hospital encounter of 05/07/22 (from the past 48 hour(s))  Comprehensive metabolic panel     Status: Abnormal   Collection Time: 05/07/22  2:03 PM  Result Value Ref Range   Sodium 143 135 - 145 mmol/L   Potassium 3.6 3.5 - 5.1 mmol/L   Chloride 106 98 - 111 mmol/L   CO2 24 22 - 32 mmol/L   Glucose, Bld 114 (H) 70 - 99 mg/dL    Comment: Glucose reference range applies only to samples taken after fasting for at least 8 hours.   BUN 6 6 - 20 mg/dL   Creatinine, Ser 9.50 0.44 - 1.00 mg/dL   Calcium 8.7 (L) 8.9 - 10.3 mg/dL   Total Protein 8.6 (H) 6.5 - 8.1 g/dL   Albumin 4.7 3.5 - 5.0 g/dL   AST 59 (H) 15 - 41 U/L   ALT 37 0 - 44 U/L   Alkaline Phosphatase 62 38 - 126 U/L   Total Bilirubin 0.7 0.3 - 1.2 mg/dL   GFR, Estimated >93 >26 mL/min    Comment: (NOTE) Calculated using the CKD-EPI Creatinine Equation (2021)    Anion gap 13 5 - 15    Comment: Performed at [facility], [address] [place], [place] [address]  Ethanol     Status: Abnormal   Collection Time: 05/07/22  2:03 PM  Result Value Ref Range   Alcohol, Ethyl  (B) 314 (HH) <10 mg/dL    Comment: CRITICAL RESULT CALLED TO, READ BACK BY AND VERIFIED WITH ANNIE SMITH 05/07/22 1524 KLW (NOTE) Lowest detectable limit for serum alcohol is 10 mg/dL.  For medical purposes only. Performed at [facility], [address] [place], [place] [address]   cbc     Status: Abnormal   Collection Time: 05/07/22  2:03 PM  Result Value Ref Range   WBC 4.4 4.0 - 10.5 K/uL   RBC 4.27 3.87 - 5.11 MIL/uL   Hemoglobin 14.3 12.0 - 15.0 g/dL   HCT 33.8 25.0 - 53.9 %   MCV 101.6 (H) 80.0 - 100.0 fL   MCH 33.5 26.0 - 34.0 pg   MCHC 32.9 30.0 - 36.0 g/dL   RDW  12.5 11.5 - 15.5 %   Platelets 250 150 - 400 K/uL   nRBC 0.0 0.0 - 0.2 %    Comment: Performed at [facility], [address] [place], [place] [address]  Urine Drug Screen, Qualitative     Status: None   Collection Time: 05/07/22  2:03 PM  Result Value Ref Range   Tricyclic, Ur Screen NONE DETECTED NONE DETECTED   Amphetamines, Ur Screen NONE DETECTED NONE DETECTED   MDMA (Ecstasy)Ur Screen NONE DETECTED NONE DETECTED   Cocaine Metabolite,Ur [place] NONE DETECTED NONE DETECTED   Opiate, Ur Screen NONE DETECTED NONE DETECTED   Phencyclidine (PCP) Ur S NONE DETECTED NONE DETECTED   Cannabinoid 50 Ng, Ur [place] NONE DETECTED NONE DETECTED   Barbiturates, Ur Screen NONE DETECTED NONE DETECTED   Benzodiazepine, Ur Scrn NONE DETECTED NONE DETECTED   Methadone Scn, Ur NONE DETECTED NONE DETECTED    Comment: (NOTE) Tricyclics + metabolites, urine    Cutoff 1000 ng/mL Amphetamines + metabolites, urine  Cutoff 1000 ng/mL MDMA (Ecstasy), urine              Cutoff 500 ng/mL Cocaine Metabolite, urine          Cutoff 300 ng/mL Opiate + metabolites, urine        Cutoff 300 ng/mL Phencyclidine (PCP), urine         Cutoff 25 ng/mL Cannabinoid, urine                 Cutoff 50 ng/mL Barbiturates + metabolites, urine  Cutoff 200 ng/mL Benzodiazepine, urine              Cutoff 200 ng/mL Methadone, urine                    Cutoff 300 ng/mL  The urine drug screen provides only a preliminary, unconfirmed analytical test result and should not be used for non-medical purposes. Clinical consideration and professional judgment should be applied to any positive drug screen result due to possible interfering substances. A more specific alternate chemical method must be used in order to obtain a confirmed analytical result. Gas chromatography / mass spectrometry (GC/MS) is the preferred confirm atory method. Performed at [facility], [address] [place], [place] [address]   POC urine preg, ED     Status: None   Collection Time: 05/07/22  2:33 PM  Result Value Ref Range   Preg Test, Ur NEGATIVE NEGATIVE    Comment:        THE SENSITIVITY OF THIS METHODOLOGY IS >24 mIU/mL   Resp Panel by RT-PCR (Flu A&B, Covid) Anterior Nasal Swab     Status: None   Collection Time: 05/07/22  5:18 PM   Specimen: Anterior Nasal Swab  Result Value Ref Range   SARS Coronavirus 2 by RT PCR NEGATIVE NEGATIVE    Comment: (NOTE) SARS-CoV-2 target nucleic acids are NOT DETECTED.  The SARS-CoV-2 RNA is generally detectable in upper respiratory specimens during the acute phase of infection. The lowest concentration of SARS-CoV-2 viral copies this assay can detect is 138 copies/mL. A negative result does not preclude SARS-Cov-2 infection and should not be used as the sole basis for treatment or other patient management decisions. A negative result may occur with  improper specimen collection/handling, submission of specimen other than nasopharyngeal swab, presence of viral mutation(s) within the areas targeted by this assay, and inadequate number of viral copies(<138 copies/mL). A negative result must be combined with clinical observations,  patient history, and epidemiological information. The expected result is Negative.  Fact Sheet for Patients:  EntrepreneurPulse.com.au  Fact Sheet  for Healthcare Providers:  IncredibleEmployment.be  This test is no t yet approved or cleared by the [place] FDA and  has been authorized for detection and/or diagnosis of SARS-CoV-2 by FDA under an Emergency Use Authorization (EUA). This EUA will remain  in effect (meaning this test can be used) for the duration of the COVID-19 declaration under Section 564(b)(1) of the Act, 21 U.S.C.section 360bbb-3(b)(1), unless the authorization is terminated  or revoked sooner.       Influenza A by PCR NEGATIVE NEGATIVE   Influenza B by PCR NEGATIVE NEGATIVE    Comment: (NOTE) The Xpert Xpress SARS-CoV-2/FLU/RSV plus assay is intended as an aid in the diagnosis of influenza from Nasopharyngeal swab specimens and should not be used as a sole basis for treatment. Nasal washings and aspirates are unacceptable for Xpert Xpress SARS-CoV-2/FLU/RSV testing.  Fact Sheet for Patients: EntrepreneurPulse.com.au  Fact Sheet for Healthcare Providers: IncredibleEmployment.be  This test is not yet approved or cleared by the [place] FDA and has been authorized for detection and/or diagnosis of SARS-CoV-2 by FDA under an Emergency Use Authorization (EUA). This EUA will remain in effect (meaning this test can be used) for the duration of the COVID-19 declaration under Section 564(b)(1) of the Act, 21 U.S.C. section 360bbb-3(b)(1), unless the authorization is terminated or revoked.  Performed at [facility], [address] [place], [place] [address]     Current Facility-Administered Medications  Medication Dose Route Frequency Provider Last Rate Last Hermenia Bers ON 1/60/7371] folic acid (FOLVITE) tablet 1 mg  1 mg Oral Daily Caroline Sauger, NP       LORazepam (ATIVAN) tablet 1-4 mg  1-4 mg Oral Q1H PRN Caroline Sauger, NP   2 mg at 05/07/22 2247   Or   LORazepam (ATIVAN) injection 1-4 mg  1-4 mg Intravenous Q1H  PRN Caroline Sauger, NP       Derrill Memo ON 05/08/2022] multivitamin with minerals tablet 1 tablet  1 tablet Oral Daily Caroline Sauger, NP       Derrill Memo ON 05/08/2022] thiamine (VITAMIN B1) tablet 100 mg  100 mg Oral Daily Caroline Sauger, NP       Or   Derrill Memo ON 05/08/2022] thiamine (VITAMIN B1) injection 100 mg  100 mg Intravenous Daily Caroline Sauger, NP       Current Outpatient Medications  Medication Sig Dispense Refill   fluconazole (DIFLUCAN) 150 MG tablet Take 150 mg by mouth every 3 (three) days. (Patient not taking: Reported on 05/07/2022)     metroNIDAZOLE (FLAGYL) 500 MG tablet Take 1 tablet (500 mg total) by mouth 2 (two) times daily. (Patient not taking: Reported on 05/07/2022) 14 tablet 0   PARAGARD INTRAUTERINE COPPER IU by Intrauterine route. Inserted 08/30/2019     Probiotic Product (PROBIOTIC DAILY PO) Take by mouth.      Musculoskeletal: Strength & Muscle Tone: within normal limits Gait & Station: normal Patient leans: N/A  Psychiatric Specialty Exam:  Presentation  General Appearance: No data recorded Eye Contact:No data recorded Speech:No data recorded Speech Volume:No data recorded Handedness:No data recorded  Mood and Affect  Mood:Depressed; Hopeless  Affect:Tearful; Depressed; Flat; Blunt   Thought Process  Thought Processes:Coherent  Descriptions of Associations:Intact  Orientation:Full (Time, Place and Person)  Thought Content:Logical  History of Schizophrenia/Schizoaffective disorder:No  Duration of Psychotic Symptoms:No data recorded Hallucinations:Hallucinations: None  Ideas of Reference:None  Suicidal Thoughts:Suicidal Thoughts: Yes, Passive SI Passive Intent and/or Plan: Without Intent; Without Plan  Homicidal Thoughts:Homicidal Thoughts: No   Sensorium  Memory:Immediate Good; Recent Good; Remote Good  Judgment:Intact  Insight:Good   Executive Functions  Concentration:Fair  Attention  Span:Fair  Recall:Good  Fund of Knowledge:Good  Language:Good   Psychomotor Activity  Psychomotor Activity:Psychomotor Activity: Normal   Assets  Assets:Communication Skills; Desire for Improvement; Resilience; Social Support   Sleep  Sleep:Sleep: Fair   Physical Exam: Physical Exam Vitals and nursing note reviewed.  Constitutional:      Appearance: Normal appearance. She is normal weight.  HENT:     Head: Normocephalic and atraumatic.     Right Ear: External ear normal.     Left Ear: External ear normal.     Nose: Nose normal.     Mouth/Throat:     Mouth: Mucous membranes are moist.  Cardiovascular:     Rate and Rhythm: Normal rate.     Pulses: Normal pulses.  Pulmonary:     Effort: Pulmonary effort is normal.  Musculoskeletal:        General: Normal range of motion.     Cervical back: Normal range of motion and neck supple.  Neurological:     General: No focal deficit present.     Mental Status: She is alert and oriented to person, place, and time.  Psychiatric:        Attention and Perception: Attention normal. She perceives visual hallucinations.        Mood and Affect: Mood is anxious and depressed. Affect is blunt and tearful.        Speech: Speech is tangential.        Behavior: Behavior is cooperative.        Thought Content: Thought content includes suicidal ideation.        Cognition and Memory: Cognition and memory normal.        Judgment: Judgment is inappropriate.    Review of Systems  Psychiatric/Behavioral:  Positive for depression, hallucinations, substance abuse and suicidal ideas. The patient is nervous/anxious.   All other systems reviewed and are negative.  Blood pressure (!) 141/79, pulse 98, temperature 98.7 F (37.1 C), temperature source Oral, resp. rate 16, height 5\' 8"  (1.727 m), weight 108.9 kg, last menstrual period 05/04/2022, SpO2 98 %. Body mass index is 36.5 kg/m.  Treatment Plan Summary: Medication management and Plan  Patient does meet criteria for psychiatric inpatient admission and will be put on CIWA protocol.  Disposition: Recommend psychiatric Inpatient admission when medically cleared. Supportive therapy provided about ongoing stressors.  Gillermo MurdochJacqueline Zaylah Blecha, NP 05/07/2022 11:42 PM

## 2022-05-08 DIAGNOSIS — F332 Major depressive disorder, recurrent severe without psychotic features: Secondary | ICD-10-CM

## 2022-05-08 DIAGNOSIS — O99345 Other mental disorders complicating the puerperium: Secondary | ICD-10-CM | POA: Diagnosis not present

## 2022-05-08 DIAGNOSIS — R45851 Suicidal ideations: Secondary | ICD-10-CM | POA: Diagnosis not present

## 2022-05-08 DIAGNOSIS — N200 Calculus of kidney: Secondary | ICD-10-CM | POA: Diagnosis not present

## 2022-05-08 NOTE — ED Provider Notes (Signed)
Emergency Medicine Observation Re-evaluation Note  Diana Estrada is a 21 y.o. female, seen on rounds today.  Pt initially presented to the ED for complaints of Suicidal Currently, the patient is resting.Marland Kitchen  Physical Exam  BP (!) 141/79   Pulse 98   Temp 98.7 F (37.1 C) (Oral)   Resp 16   Ht 1.727 m (5\' 8" )   Wt 108.9 kg   LMP 05/04/2022   SpO2 98%   BMI 36.50 kg/m  Physical Exam Gen:  No acute distress Resp:  Breathing easily and comfortably, no accessory muscle usage Neuro:  Moving all four extremities, no gross focal neuro deficits Psych:  Resting currently, calm when awake  ED Course / MDM  EKG:   I have reviewed the labs performed to date as well as medications administered while in observation.  Recent changes in the last 24 hours include initial evaluation by EDP and psychiatry..  Plan  Current plan is for transfer to [facility] in the morning.    Hinda Kehr, MD 05/08/22 [phone]

## 2022-05-08 NOTE — ED Notes (Signed)
Called safe transport for transport to [facility]  spoke to [place]

## 2022-05-08 NOTE — BH Assessment (Signed)
Referral information for Psychiatric Hospitalization faxed to;   [facility] ([phone]),  [facility] ([phone]),   [facility] ([phone] -or- [phone]),   [facility] ([phone])  [facility] ([phone])  [facility] ([phone]) 

## 2022-05-08 NOTE — ED Notes (Signed)
VOL/Accepted to [facility]

## 2022-05-08 NOTE — BH Assessment (Signed)
Patient has been accepted to [facility].  Patient assigned to [facility] Unit. Accepting physician is Dr. Kathlene Cote.  Call report to [phone].  Representative was [employer].   ER Staff is aware of it:  Holley Raring, ER Secretary  Dr. Karma Greaser, ER MD  Maudie Mercury, Patient's Nurse     Patient can arrive anytime after 9 AM 05/08/22.

## 2022-05-08 NOTE — ED Notes (Signed)
Pt unable to sign for transfer d/t no signature pad available

## 2022-06-02 DIAGNOSIS — F329 Major depressive disorder, single episode, unspecified: Secondary | ICD-10-CM | POA: Diagnosis not present

## 2022-08-20 NOTE — Progress Notes (Deleted)
There were no vitals taken for this visit.   Subjective:    Patient ID: Diana Estrada, female    DOB: July 17, 2001, 22 y.o.   MRN: [id number]  HPI: Diana Estrada is a 22 y.o. female  No chief complaint on file.  Patient presents to clinic to establish care with new PCP.  Introduced to Designer, jewellery role and practice setting.  All questions answered.  Discussed provider/patient relationship and expectations.  Patient reports a history of ***. Patient denies a history of: Hypertension, Elevated Cholesterol, Diabetes, Thyroid problems, Depression, Anxiety, Neurological problems, and Abdominal problems.   Active Ambulatory Problems    Diagnosis Date Noted   MDD (major depressive disorder), recurrent severe, without psychosis ([place]) 08/27/2013   Social anxiety disorder 08/27/2013   Eating disorder, unspecified 08/29/2013   Cannabis abuse 08/29/2013   History of recurrent UTIs 07/30/2016   Increased body mass index (BMI) 12/16/2018   Excess weight gain in pregnancy, third trimester 05/13/2019   Resolved Ambulatory Problems    Diagnosis Date Noted   UTI (urinary tract infection) 09/02/2013   Teen pregnancy 04/19/2015   Maternal varicella, non-immune 05/02/2015   Pregnancy 05/14/2015   Irregular uterine contractions 06/28/2015   Normal labor 06/29/2015   Supervision of normal pregnancy 12/16/2018   Heartburn in pregnancy in third trimester 05/13/2019   Post-dates pregnancy 07/01/2019   PROM (premature rupture of membranes) 07/01/2019   Past Medical History:  Diagnosis Date   Medical history non-contributory    Past Surgical History:  Procedure Laterality Date   NO PAST SURGERIES     Family History  Problem Relation Age of Onset   Healthy Mother      Review of Systems  Per HPI unless specifically indicated above     Objective:    There were no vitals taken for this visit.  Wt Readings from Last 3 Encounters:  05/07/22 240 lb 1.3 oz (108.9 kg)  12/13/21 240 lb  (108.9 kg)  11/25/18 218 lb (98.9 kg) (99 %, Z= 2.19)*   * Growth percentiles are based on CDC (Girls, 2-20 Years) data.    Physical Exam  Results for orders placed or performed during the hospital encounter of 05/07/22  Resp Panel by RT-PCR (Flu A&B, Covid) Anterior Nasal Swab   Specimen: Anterior Nasal Swab  Result Value Ref Range   SARS Coronavirus 2 by RT PCR NEGATIVE NEGATIVE   Influenza A by PCR NEGATIVE NEGATIVE   Influenza B by PCR NEGATIVE NEGATIVE  Comprehensive metabolic panel  Result Value Ref Range   Sodium 143 135 - 145 mmol/L   Potassium 3.6 3.5 - 5.1 mmol/L   Chloride 106 98 - 111 mmol/L   CO2 24 22 - 32 mmol/L   Glucose, Bld 114 (H) 70 - 99 mg/dL   BUN 6 6 - 20 mg/dL   Creatinine, Ser 0.78 0.44 - 1.00 mg/dL   Calcium 8.7 (L) 8.9 - 10.3 mg/dL   Total Protein 8.6 (H) 6.5 - 8.1 g/dL   Albumin 4.7 3.5 - 5.0 g/dL   AST 59 (H) 15 - 41 U/L   ALT 37 0 - 44 U/L   Alkaline Phosphatase 62 38 - 126 U/L   Total Bilirubin 0.7 0.3 - 1.2 mg/dL   GFR, Estimated >60 >60 mL/min   Anion gap 13 5 - 15  Ethanol  Result Value Ref Range   Alcohol, Ethyl (B) 314 (HH) <10 mg/dL  cbc  Result Value Ref Range   WBC 4.4 4.0 - 10.5  K/uL   RBC 4.27 3.87 - 5.11 MIL/uL   Hemoglobin 14.3 12.0 - 15.0 g/dL   HCT 43.4 36.0 - 46.0 %   MCV 101.6 (H) 80.0 - 100.0 fL   MCH 33.5 26.0 - 34.0 pg   MCHC 32.9 30.0 - 36.0 g/dL   RDW 12.5 11.5 - 15.5 %   Platelets 250 150 - 400 K/uL   nRBC 0.0 0.0 - 0.2 %  Urine Drug Screen, Qualitative  Result Value Ref Range   Tricyclic, Ur Screen NONE DETECTED NONE DETECTED   Amphetamines, Ur Screen NONE DETECTED NONE DETECTED   MDMA (Ecstasy)Ur Screen NONE DETECTED NONE DETECTED   Cocaine Metabolite,Ur [place] NONE DETECTED NONE DETECTED   Opiate, Ur Screen NONE DETECTED NONE DETECTED   Phencyclidine (PCP) Ur S NONE DETECTED NONE DETECTED   Cannabinoid 50 Ng, Ur [place] NONE DETECTED NONE DETECTED   Barbiturates, Ur Screen NONE DETECTED NONE DETECTED    Benzodiazepine, Ur Scrn NONE DETECTED NONE DETECTED   Methadone Scn, Ur NONE DETECTED NONE DETECTED  POC urine preg, ED  Result Value Ref Range   Preg Test, Ur NEGATIVE NEGATIVE      Assessment & Plan:   Problem List Items Addressed This Visit   None    Follow up plan: No follow-ups on file.

## 2022-08-21 ENCOUNTER — Ambulatory Visit: Payer: Medicaid Other | Admitting: Nurse Practitioner

## 2022-12-24 DIAGNOSIS — N898 Other specified noninflammatory disorders of vagina: Secondary | ICD-10-CM | POA: Diagnosis not present

## 2022-12-24 DIAGNOSIS — R109 Unspecified abdominal pain: Secondary | ICD-10-CM | POA: Diagnosis not present

## 2022-12-24 DIAGNOSIS — N760 Acute vaginitis: Secondary | ICD-10-CM | POA: Diagnosis not present

## 2022-12-24 DIAGNOSIS — K921 Melena: Secondary | ICD-10-CM | POA: Diagnosis not present

## 2023-05-15 ENCOUNTER — Encounter: Payer: Self-pay | Admitting: Pediatrics

## 2023-05-15 ENCOUNTER — Ambulatory Visit (INDEPENDENT_AMBULATORY_CARE_PROVIDER_SITE_OTHER): Payer: Medicaid Other | Admitting: Pediatrics

## 2023-05-15 ENCOUNTER — Ambulatory Visit: Payer: Medicaid Other | Attending: Pediatrics

## 2023-05-15 VITALS — BP 125/76 | HR 76 | Temp 98.4°F | Ht 66.0 in | Wt 198.6 lb

## 2023-05-15 DIAGNOSIS — R002 Palpitations: Secondary | ICD-10-CM

## 2023-05-15 DIAGNOSIS — F411 Generalized anxiety disorder: Secondary | ICD-10-CM | POA: Insufficient documentation

## 2023-05-15 DIAGNOSIS — Z131 Encounter for screening for diabetes mellitus: Secondary | ICD-10-CM | POA: Diagnosis not present

## 2023-05-15 DIAGNOSIS — N761 Subacute and chronic vaginitis: Secondary | ICD-10-CM

## 2023-05-15 DIAGNOSIS — Z13220 Encounter for screening for lipoid disorders: Secondary | ICD-10-CM

## 2023-05-15 DIAGNOSIS — F332 Major depressive disorder, recurrent severe without psychotic features: Secondary | ICD-10-CM

## 2023-05-15 DIAGNOSIS — Z1331 Encounter for screening for depression: Secondary | ICD-10-CM | POA: Diagnosis not present

## 2023-05-15 DIAGNOSIS — Z7689 Persons encountering health services in other specified circumstances: Secondary | ICD-10-CM | POA: Diagnosis not present

## 2023-05-15 LAB — WET PREP FOR TRICH, YEAST, CLUE
Clue Cell Exam: POSITIVE — AB
Trichomonas Exam: NEGATIVE
Yeast Exam: NEGATIVE

## 2023-05-15 MED ORDER — SERTRALINE HCL 25 MG PO TABS: 25.0000 mg | ORAL_TABLET | Freq: Every day | ORAL | 3 refills | Status: DC

## 2023-05-15 MED ORDER — PRAZOSIN HCL 1 MG PO CAPS: 1.0000 mg | ORAL_CAPSULE | Freq: Every day | ORAL | 3 refills | Status: DC

## 2023-05-15 MED ORDER — HYDROXYZINE HCL 10 MG PO TABS: 10.0000 mg | ORAL_TABLET | Freq: Three times a day (TID) | ORAL | 0 refills | Status: DC | PRN

## 2023-05-15 NOTE — Assessment & Plan Note (Addendum)
Long standing h/o MDD with anxious mood. Recurrence of conditions. Plan to start treatment with sertraline 25mg . Not interested in therapy at this time. Plan on hydroxyzine for breakthrough anxiety. Prazosin for night time panic attacks. No safety concerns. Return in 2 weeks.

## 2023-05-15 NOTE — Patient Instructions (Addendum)
Zoloft plan: take half tab for 3-4 days then take the full dose Prazosin: take 1 pill at bedtime daily  For panic attaches: take 10mg  hydroxyzine as needed  Holter will come to your home Sleep apnea testing will come to your home  Good to meet you! Welcome to [facility]!  As your primary care doctor, I look forward to working with you to help you reach your health goals.  Please be aware of a couple of logistical items: - If you message me on mychart, it may take me 1-2 business days to get back to you. This is for non-urgent messaging.  - If you require urgent clinical attention, please call the clinic or present to urgent care/emergency room - If you have labs, I typically will send a message about them in 1-2 business days. - I am not here on Mondays, otherwise will be available from Tuesday-Friday during 8a-5pm.

## 2023-05-15 NOTE — Addendum Note (Signed)
Addended by: Jackolyn Confer on: 05/15/2023 03:43 PM   Modules accepted: Level of Service

## 2023-05-15 NOTE — Progress Notes (Signed)
Establish Care Note  BP 125/76   Pulse 76   Temp 98.4 F (36.9 C) (Oral)   Ht 5\' 6"  (1.676 m)   Wt 198 lb 9.6 oz (90.1 kg)   BMI 32.05 kg/m    Subjective:    Patient ID: Diana Estrada, female    DOB: 2001-06-06, 22 y.o.   MRN: [id number]  HPI: Diana Estrada is a 22 y.o. female  Chief Complaint  Patient presents with   Anxiety    Pt states after she wakes up from sleeping she feels as if she is having panic attacks. Also mentioned spider veins in legs     Establishing care, the following was discussed today:  #h/o anxiety #night time panic attacks Reports severe anxiety present every day. She has had nighttime panic attacks at night, unrelated to nightmares Wakes up with heart racing, sweating and difficulty breathing These attacks happen 2-3x week for several months No clear trigger Previously on SSRI therapy and psychotherapy but no longer able to attend She has h/o prior trauma, no formal PTSD diagnosis Reports: sleep disturbance, low interest, guild, tearfulness, appetite variability No SI/HI STOPBANG 3  #vaginitis Has discharge, itchiness H/o recurrent yeast Not currently sexually active  #Snoring Has  snored more She wakes up gasping for air Does not feel well rested  #palpitations She notes palpitations described above She reports they happen during day sometimes out of nowhere Takes her breath away Has not happened before Feels fatigued, no night sweats, no hair loss, weight changes unsure   #HM Immunizations: due for HPV, COVID, FLU Colon cancer screen: not applicable due to age Breast cancer screen: not applicable due to age Cervical cancer screen: due  Relevant past medical, surgical, family and social history reviewed and updated as indicated. Interim medical history since our last visit reviewed. Allergies and medications reviewed and updated.  ROS per HPI unless specifically indicated above     Objective:    BP 125/76   Pulse 76    Temp 98.4 F (36.9 C) (Oral)   Ht 5\' 6"  (1.676 m)   Wt 198 lb 9.6 oz (90.1 kg)   BMI 32.05 kg/m   Wt Readings from Last 3 Encounters:  05/15/23 198 lb 9.6 oz (90.1 kg)  05/07/22 240 lb 1.3 oz (108.9 kg)  12/13/21 240 lb (108.9 kg)    Physical Exam Constitutional:      Appearance: Normal appearance.  HENT:     Head: Normocephalic and atraumatic.  Eyes:     Pupils: Pupils are equal, round, and reactive to light.  Cardiovascular:     Rate and Rhythm: Normal rate and regular rhythm.     Pulses: Normal pulses.     Heart sounds: Normal heart sounds.  Pulmonary:     Effort: Pulmonary effort is normal.     Breath sounds: Normal breath sounds.  Abdominal:     General: Abdomen is flat.     Palpations: Abdomen is soft.  Musculoskeletal:        General: Normal range of motion.     Cervical back: Normal range of motion.  Skin:    General: Skin is warm and dry.     Capillary Refill: Capillary refill takes less than 2 seconds.  Neurological:     General: No focal deficit present.     Mental Status: She is alert. Mental status is at baseline.  Psychiatric:        Mood and Affect: Mood normal.  Behavior: Behavior normal.         05/15/2023   10:37 AM 09/07/2015    8:26 PM  Depression screen PHQ 2/9  Decreased Interest 2 0  Down, Depressed, Hopeless 1 0  PHQ - 2 Score 3 0  Altered sleeping 2   Tired, decreased energy 2   Change in appetite 1   Feeling bad or failure about yourself  0   Trouble concentrating 1   Moving slowly or fidgety/restless 0   Suicidal thoughts 0   PHQ-9 Score 9   Difficult doing work/chores Somewhat difficult        05/15/2023   10:37 AM 08/30/2019   12:36 PM  GAD 7 : Generalized Anxiety Score  Nervous, Anxious, on Edge 3 2  Control/stop worrying 3 1  Worry too much - different things 3 3  Trouble relaxing 3 3  Restless 3 3  Easily annoyed or irritable 3 1  Afraid - awful might happen 2 2  Total GAD 7 Score 20 15  Anxiety Difficulty  Very difficult Very difficult      Assessment & Plan:  Assessment & Plan   MDD (major depressive disorder), recurrent severe, without psychosis (HCC) Assessment & Plan: Long standing h/o MDD with anxious mood. Recurrence of conditions. Plan to start treatment with sertraline 25mg . Not interested in therapy at this time. Plan on hydroxyzine for breakthrough anxiety. Prazosin for night time panic attacks. No safety concerns. Return in 2 weeks.  Orders: -     Sertraline HCl; Take 1 tablet (25 mg total) by mouth daily.  Dispense: 30 tablet; Refill: 3 -     hydrOXYzine HCl; Take 1 tablet (10 mg total) by mouth 3 (three) times daily as needed for anxiety.  Dispense: 30 tablet; Refill: 0  GAD (generalized anxiety disorder) -     Sertraline HCl; Take 1 tablet (25 mg total) by mouth daily.  Dispense: 30 tablet; Refill: 3 -     hydrOXYzine HCl; Take 1 tablet (10 mg total) by mouth 3 (three) times daily as needed for anxiety.  Dispense: 30 tablet; Refill: 0 -     Prazosin HCl; Take 1 capsule (1 mg total) by mouth at bedtime.  Dispense: 30 capsule; Refill: 3  Palpitations Suspect secondary to uncontrolled mental health issues above. Plan on baseline organic cause w/u as below. She did note concerns about sleep apnea, STOPBANG 3, will send for sleep test (home).  -     Home sleep test -     LONG TERM MONITOR (3-14 DAYS); Future -     Vitamin B12 -     Folate -     CBC with Differential/Platelet -     Iron -     Ferritin -     Iron and TIBC -     TSH -     Comprehensive metabolic panel  Subacute vaginitis Recurrent sx, h/o BV.  -     WET PREP FOR TRICH, YEAST, CLUE  Encounter to establish care Reviewed patient record including history, medications, problem list. Will schedule for physical.  Lipid screening -     Lipid panel  Diabetes mellitus screening -     Hemoglobin A1c  Screening for depression Mental Health Screening: As part of their intake evaluation, the patient was screened  for depression, anxiety. Meets criteria for MDD and generalized anxiety disorder vs MDD w anxious mood. PHQ9 SCORE 9, GAD7 SCORE 20. See plan under problem/diagnosis above.   Follow up plan:  Return in about 2 weeks (around 05/29/2023) for Mood.  Diana Erisman Howell Pringle, MD

## 2023-05-16 LAB — COMPREHENSIVE METABOLIC PANEL
ALT: 16 [IU]/L (ref 0–32)
AST: 16 [IU]/L (ref 0–40)
Albumin: 4.5 g/dL (ref 4.0–5.0)
Alkaline Phosphatase: 62 [IU]/L (ref 44–121)
BUN/Creatinine Ratio: 17 (ref 9–23)
BUN: 12 mg/dL (ref 6–20)
Bilirubin Total: 0.4 mg/dL (ref 0.0–1.2)
CO2: 25 mmol/L (ref 20–29)
Calcium: 9.7 mg/dL (ref 8.7–10.2)
Chloride: 102 mmol/L (ref 96–106)
Creatinine, Ser: 0.72 mg/dL (ref 0.57–1.00)
Globulin, Total: 2.8 g/dL (ref 1.5–4.5)
Glucose: 88 mg/dL (ref 70–99)
Potassium: 3.8 mmol/L (ref 3.5–5.2)
Sodium: 141 mmol/L (ref 134–144)
Total Protein: 7.3 g/dL (ref 6.0–8.5)
eGFR: 120 mL/min/{1.73_m2} (ref >59)

## 2023-05-16 LAB — VITAMIN B12: Vitamin B-12: 432 pg/mL (ref 232–1245)

## 2023-05-16 LAB — CBC WITH DIFFERENTIAL/PLATELET
Basophils Absolute: 0 10*3/uL (ref 0.0–0.2)
Basos: 0 %
EOS (ABSOLUTE): 0.1 10*3/uL (ref 0.0–0.4)
Eos: 3 %
Hematocrit: 40.3 % (ref 34.0–46.6)
Hemoglobin: 12.9 g/dL (ref 11.1–15.9)
Immature Grans (Abs): 0 10*3/uL (ref 0.0–0.1)
Immature Granulocytes: 0 %
Lymphocytes Absolute: 1.4 10*3/uL (ref 0.7–3.1)
Lymphs: 28 %
MCH: 32.4 pg (ref 26.6–33.0)
MCHC: 32 g/dL (ref 31.5–35.7)
MCV: 101 fL — ABNORMAL HIGH (ref 79–97)
Monocytes Absolute: 0.4 10*3/uL (ref 0.1–0.9)
Monocytes: 8 %
Neutrophils Absolute: 3 10*3/uL (ref 1.4–7.0)
Neutrophils: 61 %
Platelets: 202 10*3/uL (ref 150–450)
RBC: 3.98 x10E6/uL (ref 3.77–5.28)
RDW: 11.7 % (ref 11.7–15.4)
WBC: 4.9 10*3/uL (ref 3.4–10.8)

## 2023-05-16 LAB — HEMOGLOBIN A1C
Est. average glucose Bld gHb Est-mCnc: 105 mg/dL
Hgb A1c MFr Bld: 5.3 % (ref 4.8–5.6)

## 2023-05-16 LAB — IRON AND TIBC
Iron Saturation: 24 % (ref 15–55)
Iron: 91 ug/dL (ref 27–159)
Total Iron Binding Capacity: 374 ug/dL (ref 250–450)
UIBC: 283 ug/dL (ref 131–425)

## 2023-05-16 LAB — LIPID PANEL
Chol/HDL Ratio: 2.4 {ratio} (ref 0.0–4.4)
Cholesterol, Total: 160 mg/dL (ref 100–199)
HDL: 67 mg/dL (ref >39)
LDL Chol Calc (NIH): 73 mg/dL (ref 0–99)
Triglycerides: 115 mg/dL (ref 0–149)
VLDL Cholesterol Cal: 20 mg/dL (ref 5–40)

## 2023-05-16 LAB — FERRITIN: Ferritin: 147 ng/mL (ref 15–150)

## 2023-05-16 LAB — FOLATE: Folate: 4.4 ng/mL (ref >3.0)

## 2023-05-16 LAB — TSH: TSH: 0.994 u[IU]/mL (ref 0.450–4.500)

## 2023-06-02 ENCOUNTER — Ambulatory Visit: Payer: Medicaid Other | Admitting: Pediatrics

## 2023-06-09 ENCOUNTER — Ambulatory Visit: Payer: Medicaid Other | Admitting: Pediatrics

## 2023-06-11 ENCOUNTER — Encounter: Payer: Self-pay | Admitting: Pediatrics

## 2023-06-11 ENCOUNTER — Ambulatory Visit (INDEPENDENT_AMBULATORY_CARE_PROVIDER_SITE_OTHER): Payer: Medicaid Other | Admitting: Pediatrics

## 2023-06-11 VITALS — BP 133/82 | HR 80 | Temp 98.3°F | Wt 194.6 lb

## 2023-06-11 DIAGNOSIS — B9689 Other specified bacterial agents as the cause of diseases classified elsewhere: Secondary | ICD-10-CM | POA: Diagnosis not present

## 2023-06-11 DIAGNOSIS — F332 Major depressive disorder, recurrent severe without psychotic features: Secondary | ICD-10-CM | POA: Diagnosis not present

## 2023-06-11 DIAGNOSIS — Z1330 Encounter for screening examination for mental health and behavioral disorders, unspecified: Secondary | ICD-10-CM

## 2023-06-11 DIAGNOSIS — N760 Acute vaginitis: Secondary | ICD-10-CM | POA: Diagnosis not present

## 2023-06-11 DIAGNOSIS — F411 Generalized anxiety disorder: Secondary | ICD-10-CM | POA: Diagnosis not present

## 2023-06-11 MED ORDER — FLUCONAZOLE 150 MG PO TABS: 150.0000 mg | ORAL_TABLET | Freq: Once | ORAL | 0 refills | Status: AC

## 2023-06-11 MED ORDER — METRONIDAZOLE 500 MG PO TABS: 500.0000 mg | ORAL_TABLET | Freq: Two times a day (BID) | ORAL | 0 refills | Status: AC

## 2023-06-11 NOTE — Patient Instructions (Addendum)
Sertraline 25 mg, in two weeks can double. You can take in morning but make sure you take it daily.   Hydroxyzine 10mg : as needed up to 3 times a day  Prazosin 1mg  night - nightmares  For BV: flagyl/metronidazole 500mg  twice daily 7days, diflucan for yeast in case after antibiotics older  [employer]- holter monitor; I will call to see if it went to wrong address  Sleep test - I will look into this

## 2023-06-11 NOTE — Progress Notes (Signed)
Office Visit  BP 133/82   Pulse 80   Temp 98.3 F (36.8 C) (Oral)   Wt 194 lb 9.6 oz (88.3 kg)   LMP 05/25/2023 (Approximate)   SpO2 99%   BMI 31.41 kg/m    Subjective:    Patient ID: Diana Estrada, female    DOB: [birth year], 22 y.o.   MRN: [id number]  HPI: Diana Estrada is a 22 y.o. female  Chief Complaint  Patient presents with   medication mangement    Discussed the use of AI scribe software for clinical note transcription with the patient, who gave verbal consent to proceed.  History of Present Illness   The patient, with a history of anxiety and severe nightmares, reports inconsistent medication adherence due to a recent illness and menstrual cycle. She describes a period of sickness affecting the entire family, with symptoms including vomiting and diarrhea, which disrupted her medication routine. Despite this, she has been able to take prazosin intermittently. She has restarted zoloft today.  Upon resumption of her medication regimen, the patient noticed a slight improvement in her anxiety symptoms with Zoloft, describing a sense of calmness. However, she has not yet started the prescribed hydroxyzine for sleep, expressing concerns about potential sedative effects and the need to care for her children. She reports persistent difficulty sleeping and a constant sense of restlessness and racing thoughts.  In addition to her anxiety and sleep issues, the patient also reports ongoing symptoms of bacterial vaginosis (BV). She describes persistent itchiness and discomfort, suggesting the previous treatment was ineffective earlier this year.   She also expresses concerns about potential miscommunication regarding her address for the delivery of a Holter monitor and sleep study.     Relevant past medical, surgical, family and social history reviewed and updated as indicated. Interim medical history since our last visit reviewed. Allergies and medications reviewed and updated.  ROS  per HPI unless specifically indicated above     Objective:    BP 133/82   Pulse 80   Temp 98.3 F (36.8 C) (Oral)   Wt 194 lb 9.6 oz (88.3 kg)   LMP 05/25/2023 (Approximate)   SpO2 99%   BMI 31.41 kg/m   Wt Readings from Last 3 Encounters:  06/11/23 194 lb 9.6 oz (88.3 kg)  05/15/23 198 lb 9.6 oz (90.1 kg)  05/07/22 240 lb 1.3 oz (108.9 kg)     Physical Exam Constitutional:      Appearance: Normal appearance.  HENT:     Head: Normocephalic and atraumatic.  Eyes:     Pupils: Pupils are equal, round, and reactive to light.  Cardiovascular:     Rate and Rhythm: Normal rate and regular rhythm.     Pulses: Normal pulses.     Heart sounds: Normal heart sounds.  Pulmonary:     Effort: Pulmonary effort is normal.     Breath sounds: Normal breath sounds.  Musculoskeletal:        General: Normal range of motion.     Cervical back: Normal range of motion.  Skin:    General: Skin is warm and dry.     Capillary Refill: Capillary refill takes less than 2 seconds.  Neurological:     General: No focal deficit present.     Mental Status: She is alert. Mental status is at baseline.  Psychiatric:        Mood and Affect: Mood normal.        Behavior: Behavior normal.  06/11/2023    1:15 PM 05/15/2023   10:37 AM 09/07/2015    8:26 PM  Depression screen PHQ 2/9  Decreased Interest 2 2 0  Down, Depressed, Hopeless 2 1 0  PHQ - 2 Score 4 3 0  Altered sleeping 3 2   Tired, decreased energy 2 2   Change in appetite 2 1   Feeling bad or failure about yourself  0 0   Trouble concentrating 2 1   Moving slowly or fidgety/restless 1 0   Suicidal thoughts 0 0   PHQ-9 Score 14 9   Difficult doing work/chores Very difficult Somewhat difficult        06/11/2023    1:16 PM 05/15/2023   10:37 AM 08/30/2019   12:36 PM  GAD 7 : Generalized Anxiety Score  Nervous, Anxious, on Edge 3 3 2   Control/stop worrying 3 3 1   Worry too much - different things 3 3 3   Trouble relaxing 3  3 3   Restless 2 3 3   Easily annoyed or irritable 2 3 1   Afraid - awful might happen 2 2 2   Total GAD 7 Score 18 20 15   Anxiety Difficulty Very difficult Very difficult Very difficult       Assessment & Plan:  Assessment & Plan   MDD (major depressive disorder), recurrent severe, without psychosis (HCC) GAD (generalized anxiety disorder) She reports feeling calmer with Zoloft use, despite inconsistent use of Zoloft and Hydroxyzine due to illness and life stressors. We will continue Zoloft 25mg  daily for two weeks, then increase to 50mg  daily if well tolerated. Hydroxyzine 10mg  will also continue as needed for anxiety up to three times a day. She has not yet started Prazosin due to illness and concerns about sedation. We encouraged starting Prazosin 1mg  for nightmares and discussed its mechanism of action and potential side effects.  Bacterial vaginosis She has persistent symptoms despite previous treatment. We will start Metronidazole twice daily for seven days and take one dose of Diflucan after completion of Metronidazole to prevent potential yeast infection. -     Fluconazole; Take 1 tablet (150 mg total) by mouth once for 1 dose.  Dispense: 1 tablet; Refill: 0 -     metroNIDAZOLE; Take 1 tablet (500 mg total) by mouth 2 (two) times daily for 7 days.  Dispense: 14 tablet; Refill: 0  Encounter for behavioral health screening As part of their intake evaluation, the patient was screened for depression, anxiety.  PHQ9 SCORE 14, GAD7 SCORE 18. Screening results positive for tested conditions. See plan under problem/diagnosis above.   Follow up plan: Return in about 3 weeks (around 07/02/2023) for Mood, Physical.  Elery Cadenhead Howell Pringle, MD

## 2023-06-16 ENCOUNTER — Encounter: Payer: Self-pay | Admitting: Pediatrics

## 2023-07-02 ENCOUNTER — Encounter: Payer: Medicaid Other | Admitting: Pediatrics

## 2023-07-22 ENCOUNTER — Encounter: Payer: Medicaid Other | Admitting: Pediatrics

## 2023-08-19 ENCOUNTER — Encounter: Payer: Medicaid Other | Admitting: Pediatrics

## 2023-09-03 ENCOUNTER — Encounter: Payer: Self-pay | Admitting: Pediatrics

## 2023-09-03 ENCOUNTER — Ambulatory Visit (INDEPENDENT_AMBULATORY_CARE_PROVIDER_SITE_OTHER): Payer: Medicaid Other | Admitting: Pediatrics

## 2023-09-03 VITALS — BP 126/74 | HR 87 | Temp 98.3°F | Resp 16 | Ht 66.73 in | Wt 199.6 lb

## 2023-09-03 DIAGNOSIS — F411 Generalized anxiety disorder: Secondary | ICD-10-CM

## 2023-09-03 DIAGNOSIS — Z124 Encounter for screening for malignant neoplasm of cervix: Secondary | ICD-10-CM

## 2023-09-03 DIAGNOSIS — N949 Unspecified condition associated with female genital organs and menstrual cycle: Secondary | ICD-10-CM | POA: Diagnosis not present

## 2023-09-03 DIAGNOSIS — Z1330 Encounter for screening examination for mental health and behavioral disorders, unspecified: Secondary | ICD-10-CM | POA: Diagnosis not present

## 2023-09-03 DIAGNOSIS — F332 Major depressive disorder, recurrent severe without psychotic features: Secondary | ICD-10-CM | POA: Diagnosis not present

## 2023-09-03 DIAGNOSIS — R110 Nausea: Secondary | ICD-10-CM

## 2023-09-03 MED ORDER — ONDANSETRON HCL 4 MG PO TABS: 4.0000 mg | ORAL_TABLET | Freq: Three times a day (TID) | ORAL | 0 refills | Status: AC | PRN

## 2023-09-03 MED ORDER — DULOXETINE HCL 20 MG PO CPEP: 20.0000 mg | ORAL_CAPSULE | Freq: Every day | ORAL | 0 refills | Status: DC

## 2023-09-03 MED ORDER — HYDROXYZINE HCL 10 MG PO TABS: 10.0000 mg | ORAL_TABLET | Freq: Three times a day (TID) | ORAL | 0 refills | Status: DC | PRN

## 2023-09-03 NOTE — Progress Notes (Signed)
Office Visit  BP 126/74 (BP Location: Left Arm, Patient Position: Sitting, Cuff Size: Normal)   Pulse 87   Temp 98.3 F (36.8 C) (Oral)   Resp 16   Ht 5' 6.73" (1.695 m)   Wt 199 lb 9.6 oz (90.5 kg)   LMP 08/31/2023 (Approximate)   SpO2 98%   BMI 31.51 kg/m    Subjective:    Patient ID: Diana Estrada, female    DOB: April 11, 2001, 23 y.o.   MRN: [id number]  HPI: Diana Estrada is a 23 y.o. female  Chief Complaint  Patient presents with   Annual Exam   #mood follow up Deferred annual exam today due to mood concerns She has not found much benefit from the increased dose of zoloft She reports a lot of breakthrough anxiety Prazosin helping with sleep Atarax as needed  Relevant past medical, surgical, family and social history reviewed and updated as indicated. Interim medical history since our last visit reviewed. Allergies and medications reviewed and updated.  ROS per HPI unless specifically indicated above     Objective:    BP 126/74 (BP Location: Left Arm, Patient Position: Sitting, Cuff Size: Normal)   Pulse 87   Temp 98.3 F (36.8 C) (Oral)   Resp 16   Ht 5' 6.73" (1.695 m)   Wt 199 lb 9.6 oz (90.5 kg)   LMP 08/31/2023 (Approximate)   SpO2 98%   BMI 31.51 kg/m   Wt Readings from Last 3 Encounters:  09/03/23 199 lb 9.6 oz (90.5 kg)  06/11/23 194 lb 9.6 oz (88.3 kg)  05/15/23 198 lb 9.6 oz (90.1 kg)     Physical Exam Constitutional:      Appearance: Normal appearance.  Pulmonary:     Effort: Pulmonary effort is normal.  Musculoskeletal:        General: Normal range of motion.  Skin:    Comments: Normal skin color  Neurological:     General: No focal deficit present.     Mental Status: She is alert. Mental status is at baseline.  Psychiatric:        Mood and Affect: Mood normal.        Behavior: Behavior normal.        Thought Content: Thought content normal.         09/03/2023    4:17 PM 06/11/2023    1:15 PM 05/15/2023   10:37 AM 09/07/2015     8:26 PM  Depression screen PHQ 2/9  Decreased Interest 2 2 2  0  Down, Depressed, Hopeless 1 2 1  0  PHQ - 2 Score 3 4 3  0  Altered sleeping 3 3 2    Tired, decreased energy 3 2 2    Change in appetite 1 2 1    Feeling bad or failure about yourself  1 0 0   Trouble concentrating 2 2 1    Moving slowly or fidgety/restless 2 1 0   Suicidal thoughts 0 0 0   PHQ-9 Score 15 14 9    Difficult doing work/chores Very difficult Very difficult Somewhat difficult        09/03/2023    4:18 PM 06/11/2023    1:16 PM 05/15/2023   10:37 AM 08/30/2019   12:36 PM  GAD 7 : Generalized Anxiety Score  Nervous, Anxious, on Edge 3 3 3 2   Control/stop worrying 3 3 3 1   Worry too much - different things 3 3 3 3   Trouble relaxing 3 3 3 3   Restless 3 2 3  3  Easily annoyed or irritable 1 2 3 1   Afraid - awful might happen 3 2 2 2   Total GAD 7 Score 19 18 20 15   Anxiety Difficulty Very difficult Very difficult Very difficult Very difficult       Assessment & Plan:  Assessment & Plan   MDD (major depressive disorder), recurrent severe, without psychosis (HCC) GAD (generalized anxiety disorder) Nausea Assessment & Plan: Patient without benefit on zoloft. Has failed trial of lexapro in the past. Plan to start cymbalta. Encouraged to use prn hydroxyzine. Return in 2 weeks. No acute safety concerns at this time. Given switch in medication, given zofran in case of secondary effects (nausea).  Orders: -     DULoxetine HCl; Take 1 capsule (20 mg total) by mouth daily.  Dispense: 30 capsule; Refill: 0 -     hydrOXYzine HCl; Take 1 tablet (10 mg total) by mouth 3 (three) times daily as needed for anxiety.  Dispense: 30 tablet; Refill: 0       -     Ondansetron HCl; Take 1 tablet (4 mg total) by mouth every 8 (eight) hours as   needed for nausea or vomiting.  Dispense: 20 tablet; Refill: 0  Vaginal discomfort Recent treatment for BV. She would like to be retested due to vaginal discomfort. -     WET PREP FOR  TRICH, YEAST, CLUE  Encounter for behavioral health screening As part of their intake evaluation, the patient was screened for depression, anxiety.  PHQ9 SCORE 15, GAD7 SCORE 19. Screening results positive for tested conditions. See plan under problem/diagnosis above.  Follow up plan: Return in about 2 weeks (around 09/17/2023) for (virtual), Mood.  Marvens Hollars Howell Pringle, MD

## 2023-09-03 NOTE — Patient Instructions (Addendum)
Switch from zoloft to cymbalta Start taking 25mg  zoloft for 7 days and start cymbalta 20mg . After 7 days stop the zoloft and continue taking the cymbalta. You can take this new medication at any time.  I am sending zofran to take every 6-8 hours as needed if you have nausea while we switch your medications  Hydroxyzine - takes as needed for breakthrough anxiety max 3 times a day Prazosin - this is for sleep. Take this every night We are switching you from the zoloft to cymbalta

## 2023-09-04 LAB — WET PREP FOR TRICH, YEAST, CLUE
Clue Cell Exam: POSITIVE — AB
Trichomonas Exam: NEGATIVE
Yeast Exam: NEGATIVE

## 2023-09-08 ENCOUNTER — Telehealth: Payer: Medicaid Other | Admitting: Pediatrics

## 2023-09-10 ENCOUNTER — Encounter: Payer: Self-pay | Admitting: Pediatrics

## 2023-09-10 NOTE — Assessment & Plan Note (Addendum)
Patient without benefit on zoloft. Has failed trial of lexapro in the past. Plan to start cymbalta. Encouraged to use prn hydroxyzine. Return in 2 weeks. No acute safety concerns at this time. Given switch in medication, given zofran in case of secondary effects (nausea).

## 2023-09-17 ENCOUNTER — Encounter: Payer: Self-pay | Admitting: Pediatrics

## 2023-09-17 ENCOUNTER — Telehealth (INDEPENDENT_AMBULATORY_CARE_PROVIDER_SITE_OTHER): Payer: Medicaid Other | Admitting: Pediatrics

## 2023-09-17 DIAGNOSIS — B9689 Other specified bacterial agents as the cause of diseases classified elsewhere: Secondary | ICD-10-CM

## 2023-09-17 DIAGNOSIS — N760 Acute vaginitis: Secondary | ICD-10-CM | POA: Diagnosis not present

## 2023-09-17 DIAGNOSIS — F332 Major depressive disorder, recurrent severe without psychotic features: Secondary | ICD-10-CM

## 2023-09-17 DIAGNOSIS — F411 Generalized anxiety disorder: Secondary | ICD-10-CM | POA: Diagnosis not present

## 2023-09-17 MED ORDER — PRAZOSIN HCL 2 MG PO CAPS: 2.0000 mg | ORAL_CAPSULE | Freq: Every day | ORAL | 3 refills | Status: DC

## 2023-09-17 MED ORDER — DULOXETINE HCL 40 MG PO CPEP: 40.0000 mg | ORAL_CAPSULE | Freq: Every day | ORAL | 1 refills | Status: DC

## 2023-09-17 NOTE — Progress Notes (Signed)
 Telehealth Visit  I connected with  Nikira Kushnir on 09/17/23 by a video enabled telemedicine application and verified that I am speaking with the correct person using two identifiers.   I discussed the limitations of evaluation and management by telemedicine. The patient expressed understanding and agreed to proceed.  Subjective:    Patient ID: Diana Estrada, female    DOB: 2001/02/09, 23 y.o.   MRN: [id number]  HPI: Diana Estrada is a 23 y.o. female  Chief Complaint  Patient presents with   Follow-up    Going good.     Discussed the use of AI scribe software for clinical note transcription with the patient, who gave verbal consent to proceed.  History of Present Illness   Diana Estrada is a 23 year old female who presents with sleep disturbances and ongoing symptoms of bacterial vaginosis.  She experiences ongoing sleep disturbances despite being on a new medication regimen for the past couple of weeks. She is currently taking 20 mg of Prozac daily and 1 mg of prazosin  at night. Initially, the nighttime medication helped, but she continues to wake up during the night. No side effects from these medications are reported. She mentions not using hydroxyzine  much and has been taking two of each medication, Prozac and prazosin , as she still has some left in the bottle.  She reports ongoing symptoms of bacterial vaginosis, specifically itching. She has not yet picked up the prescribed medication for this condition. She recently saw the test results and the message regarding the prescription but has not acted on it yet.     Relevant past medical, surgical, family and social history reviewed and updated as indicated. Interim medical history since our last visit reviewed. Allergies and medications reviewed and updated.  ROS per HPI unless specifically indicated above     Objective:    LMP 08/31/2023 (Approximate)   Wt Readings from Last 3 Encounters:  09/03/23 199 lb 9.6 oz (90.5 kg)   06/11/23 194 lb 9.6 oz (88.3 kg)  05/15/23 198 lb 9.6 oz (90.1 kg)     Physical Exam   LIMITED EXAM GIVEN VIDEO VISIT     09/17/2023    2:24 PM 09/03/2023    4:17 PM 06/11/2023    1:15 PM 05/15/2023   10:37 AM 09/07/2015    8:26 PM  Depression screen PHQ 2/9  Decreased Interest 2 2 2 2  0  Down, Depressed, Hopeless 1 1 2 1  0  PHQ - 2 Score 3 3 4 3  0  Altered sleeping 2 3 3 2    Tired, decreased energy 2 3 2 2    Change in appetite 1 1 2 1    Feeling bad or failure about yourself  0 1 0 0   Trouble concentrating 2 2 2 1    Moving slowly or fidgety/restless 0 2 1 0   Suicidal thoughts 0 0 0 0   PHQ-9 Score 10 15 14 9    Difficult doing work/chores Somewhat difficult Very difficult Very difficult Somewhat difficult       09/17/2023    2:25 PM 09/03/2023    4:18 PM 06/11/2023    1:16 PM 05/15/2023   10:37 AM  GAD 7 : Generalized Anxiety Score  Nervous, Anxious, on Edge 2 3 3 3   Control/stop worrying 1 3 3 3   Worry too much - different things 2 3 3 3   Trouble relaxing 2 3 3 3   Restless 2 3 2 3   Easily annoyed or irritable 1 1 2  3  Afraid - awful might happen 1 3 2 2   Total GAD 7 Score 11 19 18 20   Anxiety Difficulty Somewhat difficult Very difficult Very difficult Very difficult      Assessment & Plan:  Assessment & Plan   MDD (major depressive disorder), recurrent severe, without psychosis (HCC) GAD (generalized anxiety disorder) Persistent despite current regimen of Prozac (Duloxetine ) and Prazosin . No reported side effects. -Increase Prozac to 40mg  (from 20mg ) daily. -Increase Prazosin  to 2mg  (from 1mg ) daily. -Continue current medications until finished, then start new prescriptions. -Report any new side effects such as nausea, vomiting, or headaches. -     DULoxetine  HCl; Take 1 capsule (40 mg total) by mouth daily.  Dispense: 30 capsule; Refill: 1 -     Prazosin  HCl; Take 1 capsule (2 mg total) by mouth at bedtime.  Dispense: 30 capsule; Refill: 3  Bacterial  vaginosis Persistent itching reported. Flagyll or diflucan  not yet picked up. -Pick up and start prescribed antifungal medication. -Report if symptoms persist or worsen.   Encounter for behavioral health screening As part of their intake evaluation, the patient was screened for depression, anxiety.  PHQ9 SCORE 10, GAD7 SCORE 11. Screening results positive for tested conditions. See plan under problem/diagnosis above.   Follow up plan: Return in about 2 weeks (around 10/01/2023) for Physical, Mood.  Diana Manalang P Edyn Popoca, MD   This visit was completed via video visit through MyChart due to the restrictions of the COVID-19 pandemic. All issues as above were discussed and addressed. Physical exam was done as above through visual confirmation on video through MyChart. If it was felt that the patient should be evaluated in the office, they were directed there. The patient verbally consented to this visit.} Location of the patient: home Location of the provider: work Those involved with this call:  Provider: Hadassah Nett, MD CMA:  Comer Sarah, CMA Time spent on call:  10 minutes with patient face to face via video conference. More than 50% of this time was spent in counseling and coordination of care. 20 minutes total spent in review of patient's record and preparation of their chart. Total time spent on this encounter: 30 minutes.

## 2023-09-17 NOTE — Patient Instructions (Signed)
 Go up on prozac to 40mg  and go up on prazosin  to 2mg 

## 2023-09-21 NOTE — Progress Notes (Signed)
 Appointment has been made and appt. Remind has been sent

## 2023-09-30 ENCOUNTER — Other Ambulatory Visit: Payer: Self-pay | Admitting: Pediatrics

## 2023-09-30 DIAGNOSIS — F332 Major depressive disorder, recurrent severe without psychotic features: Secondary | ICD-10-CM

## 2023-10-02 ENCOUNTER — Encounter: Payer: Self-pay | Admitting: Pediatrics

## 2023-10-06 ENCOUNTER — Ambulatory Visit: Payer: Medicaid Other | Admitting: Pediatrics

## 2023-10-08 ENCOUNTER — Ambulatory Visit (INDEPENDENT_AMBULATORY_CARE_PROVIDER_SITE_OTHER): Payer: Medicaid Other | Admitting: Pediatrics

## 2023-10-08 ENCOUNTER — Encounter: Payer: Self-pay | Admitting: Pediatrics

## 2023-10-08 VITALS — BP 112/72 | HR 80 | Temp 98.8°F | Ht 66.7 in | Wt 199.6 lb

## 2023-10-08 DIAGNOSIS — F5105 Insomnia due to other mental disorder: Secondary | ICD-10-CM | POA: Diagnosis not present

## 2023-10-08 DIAGNOSIS — Z1330 Encounter for screening examination for mental health and behavioral disorders, unspecified: Secondary | ICD-10-CM

## 2023-10-08 DIAGNOSIS — F99 Mental disorder, not otherwise specified: Secondary | ICD-10-CM

## 2023-10-08 DIAGNOSIS — F331 Major depressive disorder, recurrent, moderate: Secondary | ICD-10-CM

## 2023-10-08 DIAGNOSIS — F411 Generalized anxiety disorder: Secondary | ICD-10-CM

## 2023-10-08 DIAGNOSIS — B9689 Other specified bacterial agents as the cause of diseases classified elsewhere: Secondary | ICD-10-CM

## 2023-10-08 DIAGNOSIS — N760 Acute vaginitis: Secondary | ICD-10-CM

## 2023-10-08 DIAGNOSIS — G4700 Insomnia, unspecified: Secondary | ICD-10-CM | POA: Insufficient documentation

## 2023-10-08 MED ORDER — METRONIDAZOLE 500 MG PO TABS: 500.0000 mg | ORAL_TABLET | Freq: Two times a day (BID) | ORAL | 0 refills | Status: AC

## 2023-10-08 MED ORDER — FLUCONAZOLE 150 MG PO TABS: 150.0000 mg | ORAL_TABLET | Freq: Once | ORAL | 0 refills | Status: AC

## 2023-10-08 NOTE — Assessment & Plan Note (Addendum)
 Comorbid GAD MDD. Persistent symptoms despite treatment with Duloxetine. Previous trials of Zoloft and Lexapro. Patient reports feeling sluggish and overthinking. No improvement with cymbalta, not using Hydroxyzine. -Order GeneSight testing to guide medication management. -Continue current medications until results are available.

## 2023-10-08 NOTE — Assessment & Plan Note (Signed)
 Improved with Prazosin. Patient also taking Melatonin. -Continue Prazosin as prescribed.

## 2023-10-08 NOTE — Patient Instructions (Signed)
 The lab test will guide therapy  Plan to stay on current medications in the meantime.

## 2023-10-08 NOTE — Progress Notes (Signed)
 Office Visit  BP 112/72   Pulse 80   Temp 98.8 F (37.1 C) (Oral)   Ht 5' 6.7" (1.694 m)   Wt 199 lb 9.6 oz (90.5 kg)   LMP 10/05/2023 (Exact Date)   SpO2 98%   BMI 31.54 kg/m    Subjective:    Patient ID: Diana Estrada, female    DOB: 04/19/2001, 23 y.o.   MRN: [id number]  HPI: Diana Estrada is a 23 y.o. female  Chief Complaint  Patient presents with   Anxiety    Discussed the use of AI scribe software for clinical note transcription with the patient, who gave verbal consent to proceed.  History of Present Illness   Diana Estrada is a 23 year old female who presents with persistent mood symptoms and medication management issues.  She experiences persistent mood symptoms despite current medication management. She is taking prazosin, duloxetine, and hydroxyzine. Prazosin is effective, particularly for sleep, which she describes as improved. However, she continues to experience sluggishness, lack of motivation, and overthinking. No significant improvement with duloxetine, and previous trials with Zoloft and Lexapro were ineffective. Hydroxyzine, taken as needed, has not been helpful for her anxiety, which remains unchanged. No lightheadedness in the morning after taking prazosin. She has difficulty falling asleep, for which she takes melatonin.  She mentions a recent issue with medication refills, where she received a refill for Prudoxin instead of duloxetine, and expresses concern that this might be affecting her mood.  She reports ongoing symptoms of bacterial vaginosis, which have worsened following her menstrual cycle. She has not yet taken the prescribed medications for BV, including metronidazole and Diflucan, due to a possible issue with the prescription.     Relevant past medical, surgical, family and social history reviewed and updated as indicated. Interim medical history since our last visit reviewed. Allergies and medications reviewed and updated.  ROS per HPI unless  specifically indicated above     Objective:    BP 112/72   Pulse 80   Temp 98.8 F (37.1 C) (Oral)   Ht 5' 6.7" (1.694 m)   Wt 199 lb 9.6 oz (90.5 kg)   LMP 10/05/2023 (Exact Date)   SpO2 98%   BMI 31.54 kg/m   Wt Readings from Last 3 Encounters:  10/08/23 199 lb 9.6 oz (90.5 kg)  09/03/23 199 lb 9.6 oz (90.5 kg)  06/11/23 194 lb 9.6 oz (88.3 kg)     Physical Exam Constitutional:      Appearance: Normal appearance.  Pulmonary:     Effort: Pulmonary effort is normal.  Musculoskeletal:        General: Normal range of motion.  Skin:    Comments: Normal skin color  Neurological:     General: No focal deficit present.     Mental Status: She is alert. Mental status is at baseline.  Psychiatric:        Mood and Affect: Mood normal.        Behavior: Behavior normal.        Thought Content: Thought content normal.         10/08/2023   11:14 AM 09/17/2023    2:24 PM 09/03/2023    4:17 PM 06/11/2023    1:15 PM 05/15/2023   10:37 AM  Depression screen PHQ 2/9  Decreased Interest 2 2 2 2 2   Down, Depressed, Hopeless 1 1 1 2 1   PHQ - 2 Score 3 3 3 4 3   Altered sleeping 2 2 3  3  2  Tired, decreased energy 2 2 3 2 2   Change in appetite 1 1 1 2 1   Feeling bad or failure about yourself  2 0 1 0 0  Trouble concentrating 2 2 2 2 1   Moving slowly or fidgety/restless 2 0 2 1 0  Suicidal thoughts 0 0 0 0 0  PHQ-9 Score 14 10 15 14 9   Difficult doing work/chores Somewhat difficult Somewhat difficult Very difficult Very difficult Somewhat difficult       10/08/2023   11:14 AM 09/17/2023    2:25 PM 09/03/2023    4:18 PM 06/11/2023    1:16 PM  GAD 7 : Generalized Anxiety Score  Nervous, Anxious, on Edge 2 2 3 3   Control/stop worrying 2 1 3 3   Worry too much - different things 2 2 3 3   Trouble relaxing 1 2 3 3   Restless 1 2 3 2   Easily annoyed or irritable 2 1 1 2   Afraid - awful might happen 2 1 3 2   Total GAD 7 Score 12 11 19 18   Anxiety Difficulty Somewhat difficult Somewhat  difficult Very difficult Very difficult       Assessment & Plan:  Assessment & Plan   MDD (major depressive disorder), recurrent episode, moderate (HCC) GAD (generalized anxiety disorder) Assessment & Plan: Comorbid GAD MDD. Persistent symptoms despite treatment with Duloxetine. Previous trials of Zoloft and Lexapro. Patient reports feeling sluggish and overthinking. No improvement with cymbalta, not using Hydroxyzine. -Order GeneSight testing to guide medication management. -Continue current medications until results are available.  Insomnia due to other mental disorder Assessment & Plan: Improved with Prazosin. Patient also taking Melatonin. -Continue Prazosin as prescribed.   BV (bacterial vaginosis) Patient reports persistent symptoms. -Resend prescription for Metronidazole and Diflucan to prevent yeast infection post-antibiotic treatment. -     metroNIDAZOLE; Take 1 tablet (500 mg total) by mouth 2 (two) times daily for 7 days.  Dispense: 14 tablet; Refill: 0 -     Fluconazole; Take 1 tablet (150 mg total) by mouth once for 1 dose.  Dispense: 1 tablet; Refill: 0  Encounter for behavioral health screening As part of their intake evaluation, the patient was screened for depression, anxiety.  PHQ9 SCORE 14, GAD7 SCORE 12. Screening results positive for tested conditions. See plan under problem/diagnosis above.    Follow up plan: Return in about 25 days (around 11/02/2023) for Physical w pap.  Jackolyn Confer, MD

## 2023-11-03 ENCOUNTER — Encounter: Payer: Medicaid Other | Admitting: Pediatrics

## 2023-11-19 ENCOUNTER — Encounter: Payer: Self-pay | Admitting: Pediatrics

## 2023-11-19 ENCOUNTER — Ambulatory Visit (INDEPENDENT_AMBULATORY_CARE_PROVIDER_SITE_OTHER): Admitting: Pediatrics

## 2023-11-19 ENCOUNTER — Other Ambulatory Visit (HOSPITAL_COMMUNITY)
Admission: RE | Admit: 2023-11-19 | Discharge: 2023-11-19 | Disposition: A | Discharge status: Home / Self Care | Source: Ambulatory Visit | Attending: Pediatrics | Admitting: Pediatrics

## 2023-11-19 VITALS — BP 126/82 | HR 86 | Temp 99.1°F | Ht 67.0 in | Wt 202.6 lb

## 2023-11-19 DIAGNOSIS — R102 Pelvic and perineal pain: Secondary | ICD-10-CM | POA: Diagnosis not present

## 2023-11-19 DIAGNOSIS — Z113 Encounter for screening for infections with a predominantly sexual mode of transmission: Secondary | ICD-10-CM

## 2023-11-19 DIAGNOSIS — Z124 Encounter for screening for malignant neoplasm of cervix: Secondary | ICD-10-CM | POA: Insufficient documentation

## 2023-11-19 DIAGNOSIS — F331 Major depressive disorder, recurrent, moderate: Secondary | ICD-10-CM | POA: Diagnosis not present

## 2023-11-19 DIAGNOSIS — Z1330 Encounter for screening examination for mental health and behavioral disorders, unspecified: Secondary | ICD-10-CM

## 2023-11-19 DIAGNOSIS — Z0000 Encounter for general adult medical examination without abnormal findings: Secondary | ICD-10-CM | POA: Diagnosis not present

## 2023-11-19 DIAGNOSIS — F411 Generalized anxiety disorder: Secondary | ICD-10-CM

## 2023-11-19 DIAGNOSIS — G4700 Insomnia, unspecified: Secondary | ICD-10-CM | POA: Diagnosis not present

## 2023-11-19 DIAGNOSIS — N949 Unspecified condition associated with female genital organs and menstrual cycle: Secondary | ICD-10-CM

## 2023-11-19 LAB — WET PREP FOR TRICH, YEAST, CLUE
Clue Cell Exam: POSITIVE — AB
Trichomonas Exam: NEGATIVE
Yeast Exam: NEGATIVE

## 2023-11-19 MED ORDER — DESVENLAFAXINE SUCCINATE ER 25 MG PO TB24: 25.0000 mg | ORAL_TABLET | Freq: Every day | ORAL | 1 refills | Status: DC

## 2023-11-19 MED ORDER — TRAZODONE HCL 50 MG PO TABS: 25.0000 mg | ORAL_TABLET | Freq: Every evening | ORAL | 3 refills | Status: AC | PRN

## 2023-11-19 NOTE — Assessment & Plan Note (Signed)
 Prazosin not working, would like to try trazadone as this work well in the past. Suspect will improve with pristiq as well.

## 2023-11-19 NOTE — Progress Notes (Signed)
 BP 126/82   Pulse 86   Temp 99.1 F (37.3 C) (Oral)   Ht 5\' 7"  (1.702 m)   Wt 202 lb 9.6 oz (91.9 kg)   LMP 11/12/2023 (Exact Date)   SpO2 98%   BMI 31.73 kg/m    Annual Physical Exam - Female  Subjective:   CC: Annual Exam   Diana Estrada is a 23 y.o. female patient here for a preventative health maintenance exam. Additional topics discussed include:  Health Habits: DIET: in general, a "healthy" diet   EXERCISE: none   DENTAL EXAM: Due EYE EXAM: Due                       Relevant Gynecologic History LMP: Patient's last menstrual period was 11/12/2023 (exact date).  Menstrual Status: premenopausal, Flow regular every month without intermenstrual spotting PAP History:  No Cervical Cancer Screening results to display.  History abnormal PAP: No  Sexual activity: not sexually active, IUD in place Family history breast, ovarian cancer: Yes, ovarian cancer grandmother Domestic Violence Screen, feels safe at home: Yes, 2 kids, sister, gradma  family history includes Healthy in her mother.  Social History   Tobacco Use   Smoking status: Former    Current packs/day: 0.00    Types: Cigarettes    Quit date: 08/11/2014    Years since quitting: 9.2   Smokeless tobacco: Never  Vaping Use   Vaping status: Every Day   Start date: 09/05/2014   Substances: Nicotine, Flavoring  Substance Use Topics   Alcohol use: Yes    Comment: occasionally   Drug use: Not Currently    Types: Marijuana    Comment: last used @ 23 yrs old    Social History   Social History Narrative   Patient is a Production designer, theatre/television/film at [employer]. Lives with grandmother and 12yo sister,  and daughter 7yo and 3yo son.    Social drivers questionnaire is reviewed and is positive for : none.  Depression Screening:     11/19/2023    1:11 PM 10/08/2023   11:14 AM 09/17/2023    2:24 PM 09/03/2023    4:17 PM 06/11/2023    1:15 PM  Depression screen PHQ 2/9  Decreased Interest 2 2 2 2 2   Down, Depressed, Hopeless 2 1  1 1 2   PHQ - 2 Score 4 3 3 3 4   Altered sleeping 2 2 2 3 3   Tired, decreased energy 1 2 2 3 2   Change in appetite 1 1 1 1 2   Feeling bad or failure about yourself  0 2 0 1 0  Trouble concentrating 1 2 2 2 2   Moving slowly or fidgety/restless 0 2 0 2 1  Suicidal thoughts 0 0 0 0 0  PHQ-9 Score 9 14 10 15 14   Difficult doing work/chores Somewhat difficult Somewhat difficult Somewhat difficult Very difficult Very difficult       11/19/2023    1:11 PM 10/08/2023   11:14 AM 09/17/2023    2:25 PM 09/03/2023    4:18 PM  GAD 7 : Generalized Anxiety Score  Nervous, Anxious, on Edge 3 2 2 3   Control/stop worrying 3 2 1 3   Worry too much - different things 1 2 2 3   Trouble relaxing 3 1 2 3   Restless 2 1 2 3   Easily annoyed or irritable 3 2 1 1   Afraid - awful might happen 1 2 1 3   Total GAD 7 Score 16 12  11 19  Anxiety Difficulty Somewhat difficult Somewhat difficult Somewhat difficult Very difficult    Mental Health Plan:  plan to start pristiq and trazadone qhs  Self Management Goals  Goals   None     Health Maintenance Colon Cancer Screening : Not applicable Mammogram : Not applicable DXA scan : Not applicable Immunizations : up to date and documented  Review of Systems See HPI for relevant ROS.  Outpatient Medications Prior to Visit  Medication Sig Dispense Refill   ondansetron (ZOFRAN) 4 MG tablet Take 1 tablet (4 mg total) by mouth every 8 (eight) hours as needed for nausea or vomiting. 20 tablet 0   PARAGARD INTRAUTERINE COPPER IU by Intrauterine route. Inserted 08/30/2019     DULoxetine 40 MG CPEP Take 1 capsule (40 mg total) by mouth daily. 30 capsule 1   prazosin (MINIPRESS) 2 MG capsule Take 1 capsule (2 mg total) by mouth at bedtime. 30 capsule 3   No facility-administered medications prior to visit.     Patient Active Problem List   Diagnosis Date Noted   Insomnia 10/08/2023   GAD (generalized anxiety disorder) 05/15/2023   Increased body mass index (BMI)  12/16/2018   History of recurrent UTIs 07/30/2016   MDD (major depressive disorder), recurrent episode, moderate (HCC) 08/27/2013    Objective:   Vitals:   11/19/23 1304 11/19/23 1310  BP: (!) 143/78 126/82  Pulse: 84 86  Temp: 99.1 F (37.3 C)   Height: 5\' 7"  (1.702 m)   Weight: 202 lb 9.6 oz (91.9 kg)   SpO2: 98%   TempSrc: Oral   BMI (Calculated): 31.72     Body mass index is 31.73 kg/m.  Physical Exam Exam conducted with a chaperone present.  Constitutional:      Appearance: Normal appearance.  HENT:     Head: Normocephalic and atraumatic.  Eyes:     Pupils: Pupils are equal, round, and reactive to light.  Cardiovascular:     Rate and Rhythm: Normal rate and regular rhythm.     Pulses: Normal pulses.     Heart sounds: Normal heart sounds.  Pulmonary:     Effort: Pulmonary effort is normal.     Breath sounds: Normal breath sounds.  Abdominal:     General: Abdomen is flat.     Palpations: Abdomen is soft.  Genitourinary:    General: Normal vulva.     Exam position: Lithotomy position.     Vagina: Normal.     Cervix: Normal.  Musculoskeletal:        General: Normal range of motion.     Cervical back: Normal range of motion.  Skin:    General: Skin is warm and dry.     Capillary Refill: Capillary refill takes less than 2 seconds.  Neurological:     General: No focal deficit present.     Mental Status: She is alert. Mental status is at baseline.  Psychiatric:        Mood and Affect: Mood normal.        Behavior: Behavior normal.    Assessment and Plan:   Annual physical exam Discussed lifestyle modifications and goals including plant based eating styles (such as: Mediterranean eating style), regular exercise (at least 150 min of moderate-intensity aerobic exercise per week, given AHA workout handout), get adequate sleep, and continue working with PCP towards meeting health goals to ensure healthy aging. Opto referral requested. -     Ambulatory  referral to Optometry  MDD (  major depressive disorder), recurrent episode, moderate (HCC) Assessment & Plan: Completed genesight testing with expected good results/tolerance with pristiq. Will start trial.   Orders: -     Desvenlafaxine Succinate ER; Take 1 tablet (25 mg total) by mouth daily.  Dispense: 30 tablet; Refill: 1  GAD (generalized anxiety disorder) -     Desvenlafaxine Succinate ER; Take 1 tablet (25 mg total) by mouth daily.  Dispense: 30 tablet; Refill: 1  Insomnia, unspecified type Assessment & Plan: Prazosin not working, would like to try trazadone as this work well in the past. Suspect will improve with pristiq as well.  Orders: -     traZODone HCl; Take 0.5-1 tablets (25-50 mg total) by mouth at bedtime as needed for sleep.  Dispense: 30 tablet; Refill: 3  Encounter for behavioral health screening As part of their intake evaluation, the patient was screened for depression, anxiety.  PHQ9 SCORE 9, GAD7 SCORE 16. Screening results positive for tested conditions. See plan under problem/diagnosis above.  Screening for cervical cancer -     Cytology - PAP  Screen for STD (sexually transmitted disease) -     Chlamydia/Gonococcus/Trichomonas, NAA  Vaginal discomfort Recently treated, wanting to confirm resolution. -     WET PREP FOR TRICH, YEAST, CLUE  Suprapubic pain Grandmother recently diagnosed with ovarian cancer and she has been having suprapubic tenderness. Normal exam. Sending imaging as below. -     US PELVIC COMPLETE WITH TRANSVAGINAL; Future     This plan was discussed with the patient and questions were answered. There were no further concerns.  Follow up as indicated, or sooner should any new problems arise, if conditions worsen, or if they are otherwise concerned.   See patient instructions for additional information.  Jackolyn Confer, MD  Family Medicine      Future Appointments  Date Time Provider Department Center  12/15/2023  8:40 AM Jackolyn Confer, MD [facility]

## 2023-11-19 NOTE — Patient Instructions (Signed)
 [facility] Specialty Address [facility] Phone Number Practice Name  [place] [facility] [address] [place] [phone] Alric Quan CRISP DDS MS PA  [place] [facility] [address] [place] [phone] Enid Derry DDS PA III  [place] [facility] [address] [place] [phone] [facility]  [place] [facility]ist [address] [place] [phone] [facility]  [place] [facility] [address] [place] [phone] Sheral Apley IV DMD PA  [place] [facility]ist [address] [place] [phone] [facility] FAMILY DENTISTRY  [place] [facility] Dentist [address] [place] [phone] [facility] General Dentist [address] [place] [phone] Yevette Edwards  [place] [facility] Dentist [address] [place] [phone] [facility] [address]T  [place] [facility] [address] [place] [phone] [facility] Oral Surgeon [address] Louellen Molder [phone] Charna Archer DMD MS [facility]  [place] [facility]ist [address] [place] [phone] [facility] PEDIATRIC DENTISTRY

## 2023-11-19 NOTE — Assessment & Plan Note (Signed)
 Completed genesight testing with expected good results/tolerance with pristiq. Will start trial.

## 2023-11-19 NOTE — Assessment & Plan Note (Deleted)
 Prazosin not working, would like to try trazadone as this work well in the past. Suspect will improve with pristiq as well.

## 2023-11-20 ENCOUNTER — Other Ambulatory Visit: Payer: Self-pay | Admitting: Pediatrics

## 2023-11-20 ENCOUNTER — Telehealth: Payer: Self-pay | Admitting: Pediatrics

## 2023-11-20 DIAGNOSIS — B9689 Other specified bacterial agents as the cause of diseases classified elsewhere: Secondary | ICD-10-CM

## 2023-11-20 MED ORDER — CLINDAMYCIN PHOSPHATE 2 % VA CREA: 1.0000 | TOPICAL_CREAM | Freq: Every day | VAGINAL | 0 refills | Status: AC

## 2023-11-20 NOTE — Progress Notes (Signed)
 Sent clinda 2% vaginal for recurrent BV  Jackolyn Confer, MD

## 2023-11-20 NOTE — Telephone Encounter (Signed)
 Cover my meds [facility] PA Request Clindamycin Phosphate 2% Cream  Key: [id number]

## 2023-11-22 LAB — CHLAMYDIA/GONOCOCCUS/TRICHOMONAS, NAA
Chlamydia by NAA: NEGATIVE
Gonococcus by NAA: NEGATIVE
Trich vag by NAA: NEGATIVE

## 2023-11-23 NOTE — Telephone Encounter (Signed)
 Patient notified. Pharmacy stated that prescription will be ready for pick up 4/15

## 2023-11-23 NOTE — Telephone Encounter (Signed)
 PA has been approved will call and update pharmacy

## 2023-11-23 NOTE — Telephone Encounter (Signed)
 PA has been started

## 2023-11-26 ENCOUNTER — Ambulatory Visit
Admission: RE | Admit: 2023-11-26 | Discharge: 2023-11-26 | Disposition: A | Discharge status: Home / Self Care | Source: Ambulatory Visit | Attending: Pediatrics | Admitting: Pediatrics

## 2023-11-26 DIAGNOSIS — R102 Pelvic and perineal pain: Secondary | ICD-10-CM | POA: Insufficient documentation

## 2023-11-26 LAB — CYTOLOGY - PAP
Diagnosis: NEGATIVE
Diagnosis: REACTIVE

## 2023-12-08 ENCOUNTER — Ambulatory Visit: Admitting: Pediatrics

## 2023-12-09 ENCOUNTER — Ambulatory Visit (INDEPENDENT_AMBULATORY_CARE_PROVIDER_SITE_OTHER): Admitting: Pediatrics

## 2023-12-09 ENCOUNTER — Ambulatory Visit: Attending: Pediatrics

## 2023-12-09 VITALS — BP 120/76 | HR 80 | Temp 97.9°F | Wt 200.6 lb

## 2023-12-09 DIAGNOSIS — F411 Generalized anxiety disorder: Secondary | ICD-10-CM

## 2023-12-09 DIAGNOSIS — F331 Major depressive disorder, recurrent, moderate: Secondary | ICD-10-CM | POA: Diagnosis not present

## 2023-12-09 DIAGNOSIS — R002 Palpitations: Secondary | ICD-10-CM

## 2023-12-09 DIAGNOSIS — F17200 Nicotine dependence, unspecified, uncomplicated: Secondary | ICD-10-CM | POA: Diagnosis not present

## 2023-12-09 MED ORDER — DESVENLAFAXINE SUCCINATE ER 50 MG PO TB24: 50.0000 mg | ORAL_TABLET | Freq: Every day | ORAL | 1 refills | Status: DC

## 2023-12-09 NOTE — Patient Instructions (Addendum)
 Voltaren gel - over the counter topical for chest discomfort  For the pristiq , we are increasing to 50mg  daily and re-evaluate in 4 weeks.

## 2023-12-09 NOTE — Progress Notes (Unsigned)
 Office Visit  BP 120/76   Pulse 80   Temp 97.9 F (36.6 C) (Oral)   Wt 200 lb 9.6 oz (91 kg)   LMP 11/12/2023 (Exact Date)   SpO2 98%   BMI 31.42 kg/m    Subjective:    Patient ID: Diana Estrada, female    DOB: 2000/08/15, 23 y.o.   MRN: [id number]  HPI: Diana Estrada is a 23 y.o. female  Chief Complaint  Patient presents with  . Depression    Discussed the use of AI scribe software for clinical note transcription with the patient, who gave verbal consent to proceed.  History of Present Illness   Diana Estrada is a 23 year old female who presents with chest discomfort and anxiety.  She experiences chest discomfort described as a strain and occasional pain. There is a history of a heart murmur as a baby and a visible hole in her chest, which she believed resolved. The discomfort is sometimes associated with vaping. The pain is not consistent, occurring randomly and lasting only a few seconds. It is palpable when touched, suggesting a muscular component.  She has been experiencing anxiety, which she describes as still present but somewhat dull. She is currently on the lowest dose of Pristiq , which she feels is only moderately effective. She has not experienced any side effects from the medication. She occasionally uses trazodone  for sleep but not consistently, as she sometimes forgets to take it.  Her past medical history includes breathing problems and severe asthma during childhood. She does not require refills for trazodone  or Zofran  at this time.        Relevant past medical, surgical, family and social history reviewed and updated as indicated. Interim medical history since our last visit reviewed. Allergies and medications reviewed and updated.  ROS per HPI unless specifically indicated above     Objective:    BP 120/76   Pulse 80   Temp 97.9 F (36.6 C) (Oral)   Wt 200 lb 9.6 oz (91 kg)   LMP 11/12/2023 (Exact Date)   SpO2 98%   BMI 31.42 kg/m   Wt Readings  from Last 3 Encounters:  12/09/23 200 lb 9.6 oz (91 kg)  11/19/23 202 lb 9.6 oz (91.9 kg)  10/08/23 199 lb 9.6 oz (90.5 kg)     Physical Exam      12/09/2023    9:35 AM 11/19/2023    1:11 PM 10/08/2023   11:14 AM 09/17/2023    2:24 PM 09/03/2023    4:17 PM  Depression screen PHQ 2/9  Decreased Interest 2 2 2 2 2   Down, Depressed, Hopeless 1 2 1 1 1   PHQ - 2 Score 3 4 3 3 3   Altered sleeping 1 2 2 2 3   Tired, decreased energy 1 1 2 2 3   Change in appetite 1 1 1 1 1   Feeling bad or failure about yourself  1 0 2 0 1  Trouble concentrating 2 1 2 2 2   Moving slowly or fidgety/restless 0 0 2 0 2  Suicidal thoughts 0 0 0 0 0  PHQ-9 Score 9 9 14 10 15   Difficult doing work/chores Somewhat difficult Somewhat difficult Somewhat difficult Somewhat difficult Very difficult       12/09/2023    9:35 AM 11/19/2023    1:11 PM 10/08/2023   11:14 AM 09/17/2023    2:25 PM  GAD 7 : Generalized Anxiety Score  Nervous, Anxious, on Edge 2 3 2  2  Control/stop worrying 2 3 2 1   Worry too much - different things 2 1 2 2   Trouble relaxing 1 3 1 2   Restless 1 2 1 2   Easily annoyed or irritable 1 3 2 1   Afraid - awful might happen 1 1 2 1   Total GAD 7 Score 10 16 12 11   Anxiety Difficulty Not difficult at all Somewhat difficult Somewhat difficult Somewhat difficult       Assessment & Plan:  Assessment & Plan   Palpitations -     EKG 12-Lead -     LONG TERM MONITOR (3-14 DAYS); Future  MDD (major depressive disorder), recurrent episode, moderate (HCC) -     Desvenlafaxine  Succinate ER; Take 1 tablet (50 mg total) by mouth daily.  Dispense: 30 tablet; Refill: 1  GAD (generalized anxiety disorder) -     Desvenlafaxine  Succinate ER; Take 1 tablet (50 mg total) by mouth daily.  Dispense: 30 tablet; Refill: 1  Nicotine  dependence due to vaping non-tobacco product     Assessment and Plan    Chest pain Intermittent chest pain likely musculoskeletal. Consider vaping-related pleuritic pain and  anxiety. Unlikely related to childhood heart murmur. - Perform EKG to assess heart rhythm. - If EKG normal, order 14-day Holter monitor. - Recommend diclofenac gel for musculoskeletal discomfort. - Advise heating pads and ibuprofen  for pain relief.  Vaping-related pleuritic pain Vaping may cause pleuritic pain due to lung lining inflammation. - Advise on vaping's impact on pleuritic pain and consider reducing or quitting.  Anxiety Anxiety persists, current Pristiq  dose minimally effective, no side effects. - Increase Pristiq  to 50 mg. - Schedule follow-up virtual appointment in four weeks.         Follow up plan: Return in about 4 weeks (around 01/06/2024) for (virtual), Mood.  Diana Letters, MD

## 2023-12-10 DIAGNOSIS — F17200 Nicotine dependence, unspecified, uncomplicated: Secondary | ICD-10-CM | POA: Insufficient documentation

## 2023-12-14 ENCOUNTER — Encounter: Payer: Self-pay | Admitting: Pediatrics

## 2023-12-14 NOTE — Assessment & Plan Note (Signed)
 Vaping may cause pleuritic pain due to lung lining inflammation. Discussed cessation and available treatment including nicotine  replacement options, pharmacologic treatment, and/or online resources. Based on our discussion, She does not plan to  initiate treatment today. Total time spent on discussion: 3 minutes.

## 2023-12-14 NOTE — Assessment & Plan Note (Signed)
 Anxiety and MDD persists, current Pristiq  dose minimally effective, no side effects. - Increase Pristiq  to 50 mg. - Schedule follow-up virtual appointment in four weeks.

## 2023-12-15 ENCOUNTER — Ambulatory Visit: Admitting: Pediatrics

## 2024-01-05 ENCOUNTER — Telehealth (INDEPENDENT_AMBULATORY_CARE_PROVIDER_SITE_OTHER): Admitting: Pediatrics

## 2024-01-05 ENCOUNTER — Encounter: Payer: Self-pay | Admitting: Pediatrics

## 2024-01-05 DIAGNOSIS — R002 Palpitations: Secondary | ICD-10-CM | POA: Diagnosis not present

## 2024-01-05 DIAGNOSIS — F411 Generalized anxiety disorder: Secondary | ICD-10-CM | POA: Diagnosis not present

## 2024-01-05 DIAGNOSIS — F331 Major depressive disorder, recurrent, moderate: Secondary | ICD-10-CM

## 2024-01-05 MED ORDER — DESVENLAFAXINE SUCCINATE ER 50 MG PO TB24
50.0000 mg | ORAL_TABLET | Freq: Every day | ORAL | 3 refills | Status: DC
Start: 2024-01-05 — End: 2024-03-15

## 2024-01-05 NOTE — Patient Instructions (Signed)
 Will message you once I receive the holter results  Continue pristiq  at current dose

## 2024-01-05 NOTE — Assessment & Plan Note (Signed)
 Stable on current Pristiq  dose, well-tolerated. - Continue current Pristiq  dose. - Schedule follow-up for mood assessment in August before the 20th. - Provide refills to last through summer.

## 2024-01-05 NOTE — Progress Notes (Signed)
 Telehealth Visit  I connected with  Diana Estrada on 01/05/24 by a video enabled telemedicine application and verified that I am speaking with the correct person using two identifiers.   I discussed the limitations of evaluation and management by telemedicine. The patient expressed understanding and agreed to proceed.  Subjective:    Patient ID: Diana Estrada, female    DOB: Nov 15, 2000, 23 y.o.   MRN: [id number]  HPI: Diana Estrada is a 23 y.o. female  Chief Complaint  Patient presents with   Mood    Been ok   Heart monitor results    Discussed the use of AI scribe software for clinical note transcription with the patient, who gave verbal consent to proceed.  History of Present Illness   Diana Estrada is a 23 year old female who presents for follow-up on Pristiq  dosage and Holter monitor results.  She is currently on a higher dose of Pristiq  and feels well on it. Initially, she experienced increased alertness, which felt unusual, but this sensation subsided as she adjusted to the medication. She prefers to maintain the current dosage for now.  She underwent a Holter monitor test due to palpitations. The monitor was removed one day early because it caused significant itching. The results of the Holter monitor have not yet been received, and she is awaiting further information.         Relevant past medical, surgical, family and social history reviewed and updated as indicated. Interim medical history since our last visit reviewed. Allergies and medications reviewed and updated.  ROS per HPI unless specifically indicated above     Objective:     There were no vitals taken for this visit.  Wt Readings from Last 3 Encounters:  12/09/23 200 lb 9.6 oz (91 kg)  11/19/23 202 lb 9.6 oz (91.9 kg)  10/08/23 199 lb 9.6 oz (90.5 kg)     Physical Exam Constitutional:      General: She is not in acute distress.    Appearance: Normal appearance.  Neurological:     General: No  focal deficit present.     Mental Status: She is alert. Mental status is at baseline.      LIMITED EXAM GIVEN VIDEO VISIT     Assessment & Plan:  Assessment & Plan   MDD (major depressive disorder), recurrent episode, moderate (HCC) GAD (generalized anxiety disorder) Assessment & Plan: Stable on current Pristiq  dose, well-tolerated. - Continue current Pristiq  dose. - Provide refills to last through summer.   Orders: -     Desvenlafaxine  Succinate ER; Take 1 tablet (50 mg total) by mouth daily.  Dispense: 90 tablet; Refill: 3  Palpitations Presumable related to above. Awaiting Holter results for further evaluation.  Follow up plan: Return in about 3 months (around 04/06/2024) for Mood.  Hadassah Letters, MD   This visit was completed via video visit through MyChart due to the restrictions of the COVID-19 pandemic. All issues as above were discussed and addressed. Physical exam was done as above through visual confirmation on video through MyChart. If it was felt that the patient should be evaluated in the office, they were directed there. The patient verbally consented to this visit.  Location of the patient: parking lot Location of the provider: work Those involved with this call:  Provider: Geraldine Kling, MD CMA: Danella Dunn, CMA Time spent on call: 10 minutes with patient face to face via video conference. More than 50% of this time was spent in counseling and  coordination of care. 10 minutes total spent in review of patient's record and preparation of their chart. Total time spent on this encounter: 20 minutes.

## 2024-03-15 ENCOUNTER — Encounter: Payer: Self-pay | Admitting: Pediatrics

## 2024-03-15 ENCOUNTER — Telehealth: Payer: Self-pay | Admitting: Pediatrics

## 2024-03-15 DIAGNOSIS — F331 Major depressive disorder, recurrent, moderate: Secondary | ICD-10-CM

## 2024-03-15 DIAGNOSIS — N949 Unspecified condition associated with female genital organs and menstrual cycle: Secondary | ICD-10-CM

## 2024-03-15 DIAGNOSIS — F411 Generalized anxiety disorder: Secondary | ICD-10-CM

## 2024-03-15 DIAGNOSIS — R002 Palpitations: Secondary | ICD-10-CM

## 2024-03-15 DIAGNOSIS — R197 Diarrhea, unspecified: Secondary | ICD-10-CM

## 2024-03-15 MED ORDER — DESVENLAFAXINE SUCCINATE ER 100 MG PO TB24: 100.0000 mg | ORAL_TABLET | Freq: Every day | ORAL | 2 refills | Status: AC

## 2024-03-15 NOTE — Patient Instructions (Addendum)
 Plan: Increase pristiq  to 100mg   Try half dose of trazodone  for sleep  Stop by the lab for testing

## 2024-03-15 NOTE — Progress Notes (Signed)
 Telehealth Visit  I connected with  Magdala Brahmbhatt on 03/20/24 by a video enabled telemedicine application and verified that I am speaking with the correct person using two identifiers.   I discussed the limitations of evaluation and management by telemedicine. The patient expressed understanding and agreed to proceed.  Subjective:    Patient ID: Diana Estrada, female    DOB: 06-12-2001, 23 y.o.   MRN: [id number]  HPI: Diana Estrada is a 23 y.o. female  Chief Complaint  Patient presents with   Anxiety   Depression    Discussed the use of AI scribe software for clinical note transcription with the patient, who gave verbal consent to proceed.  History of Present Illness   Diana Estrada is a 23 year old female who presents with worsening symptoms of bacterial vaginosis and gastrointestinal concerns.  She experiences worsening symptoms of bacterial vaginosis, including increased itching and discomfort compared to previous episodes. The itching is more severe, described as 'a worser itch.' She also notes frequent urination but is unsure if it is related to the bacterial vaginosis or her caffeine intake. No new sexual partners have been reported.  She has gastrointestinal symptoms, including cramping and changes in bowel movements, described as 'more stronger' than before. She has not noticed constipation, rather the opposite, and has been trying to reduce caffeine intake.  She is currently taking Pristiq  in the morning, which she feels is working well without causing sleepiness. She occasionally uses trazodone  as needed for insomnia but finds it makes her feel groggy the next day. She has not tried taking half a tablet of trazodone . Her symptoms fluctuate, with some days being better than others, and she feels that her current medication dose could be increased as she experiences breakthrough symptoms.  She previously sent back a Holter monitor for evaluation of palpitations, which have  improved since the last visit. She has been checking her MyChart for results but has not seen them yet.      Relevant past medical, surgical, family and social history reviewed and updated as indicated. Interim medical history since our last visit reviewed. Allergies and medications reviewed and updated.  ROS per HPI unless specifically indicated above     Objective:    There were no vitals taken for this visit.  Wt Readings from Last 3 Encounters:  12/09/23 200 lb 9.6 oz (91 kg)  11/19/23 202 lb 9.6 oz (91.9 kg)  10/08/23 199 lb 9.6 oz (90.5 kg)     Physical Exam Constitutional:      General: She is not in acute distress.    Appearance: Normal appearance.  Neurological:     General: No focal deficit present.     Mental Status: She is alert. Mental status is at baseline.      LIMITED EXAM GIVEN VIDEO VISIT     Assessment & Plan:  Assessment & Plan    MDD (major depressive disorder), recurrent episode, moderate (HCC) GAD (generalized anxiety disorder) Assessment & Plan: Her symptoms fluctuate on Pristiq , suggesting a possible dosage adjustment. Increase Pristiq  to 100 mg. Advise doubling the current dose until the new prescription is filled.  Orders: -     Desvenlafaxine  Succinate ER; Take 1 tablet (100 mg total) by mouth daily.  Dispense: 30 tablet; Refill: 2  Vaginal discomfort She experiences recurrent symptoms with increased itching and discomfort. The differential includes a yeast infection. Order a wet prep swab for BV and yeast. Advise a lab visit for swab collection and schedule  a follow-up for examination. -     WET PREP FOR TRICH, YEAST, CLUE; Future  Diarrhea, unspecified type Symptoms persist and may relate to gut health. Order stool tests for infections and provide a stool test kit for home collection. -     Cdiff NAA+O+P+Stool Culture; Future -     H. pylori antigen, stool; Future    Palpitations The frequency of palpitations has decreased. Await  Holter monitor results. Contact the vendor for these results.   Follow up plan: No follow-ups on file.  Hadassah SHAUNNA Nett, MD   This visit was completed via video visit through MyChart due to the restrictions of the COVID-19 pandemic. All issues as above were discussed and addressed. Physical exam was done as above through visual confirmation on video through MyChart. If it was felt that the patient should be evaluated in the office, they were directed there. The patient verbally consented to this visit.  Location of the patient: home Location of the provider: work Those involved with this call:  Provider: Hadassah Nett, MD CMA: Cena Maffucci, CMA Time spent on call: 15 minutes with patient face to face via video conference. More than 50% of this time was spent in counseling and coordination of care. 15 minutes total spent in review of patient's record and preparation of their chart. Total time spent on this encounter: 30 minutes.

## 2024-03-16 ENCOUNTER — Other Ambulatory Visit

## 2024-03-16 DIAGNOSIS — R197 Diarrhea, unspecified: Secondary | ICD-10-CM

## 2024-03-16 DIAGNOSIS — N949 Unspecified condition associated with female genital organs and menstrual cycle: Secondary | ICD-10-CM

## 2024-03-16 LAB — WET PREP FOR TRICH, YEAST, CLUE
Clue Cell Exam: NEGATIVE
Trichomonas Exam: NEGATIVE
Yeast Exam: NEGATIVE

## 2024-03-17 ENCOUNTER — Ambulatory Visit: Payer: Self-pay | Admitting: Pediatrics

## 2024-03-17 ENCOUNTER — Telehealth: Payer: Self-pay

## 2024-03-17 NOTE — Telephone Encounter (Signed)
 Copied from CRM [id number]. Topic: General - Other >> Mar 17, 2024  3:51 PM Diana Estrada wrote: Reason for CRM: Pls call patient .Diana Estrada will drop stool on Tuesday.. wasn't able to do it today.. if not okay pls call her

## 2024-03-20 ENCOUNTER — Encounter: Payer: Self-pay | Admitting: Pediatrics

## 2024-03-20 NOTE — Assessment & Plan Note (Signed)
 Her symptoms fluctuate on Pristiq , suggesting a possible dosage adjustment. Increase Pristiq  to 100 mg. Advise doubling the current dose until the new prescription is filled.

## 2024-03-25 ENCOUNTER — Other Ambulatory Visit: Payer: Self-pay | Admitting: Pediatrics

## 2024-03-25 DIAGNOSIS — A0472 Enterocolitis due to Clostridium difficile, not specified as recurrent: Secondary | ICD-10-CM

## 2024-03-25 MED ORDER — FIDAXOMICIN 200 MG PO TABS: 200.0000 mg | ORAL_TABLET | Freq: Two times a day (BID) | ORAL | 0 refills | Status: DC

## 2024-03-25 NOTE — Progress Notes (Signed)
 Sent dificid  treatment for 10 days. Positive c diff on stool testing.  Diana SHAUNNA Nett, MD

## 2024-03-26 LAB — CDIFF NAA+O+P+STOOL CULTURE
E coli, Shiga toxin Assay: NEGATIVE
Toxigenic C. Difficile by PCR: POSITIVE — AB

## 2024-03-26 LAB — H. PYLORI ANTIGEN, STOOL: H pylori Ag, Stl: NEGATIVE

## 2024-03-28 ENCOUNTER — Ambulatory Visit: Payer: Self-pay

## 2024-03-28 ENCOUNTER — Other Ambulatory Visit: Payer: Self-pay

## 2024-03-28 DIAGNOSIS — A0472 Enterocolitis due to Clostridium difficile, not specified as recurrent: Secondary | ICD-10-CM

## 2024-03-28 NOTE — Telephone Encounter (Signed)
 CRM # [id number] Owner: None Status: Unresolved Open  Priority: Routine Created on: 03/25/2024 04:50 PM By: Gerlean Kip HERO   Primary Information  Source  Jackson Lefort (Patient)   Subject  Jackson Lefort (Patient)   Topic  Clinical - Medication Question    Communication  Reason for RMF:mzji pt note from Provider and informed of meds sent to [facility],  Patient is asking what is c diff for clarification / better understanding    Pt num [phone] (M)

## 2024-03-28 NOTE — Telephone Encounter (Signed)
 FYI Only or Action Required?: FYI only for provider.  Patient was last seen in primary care on 03/15/2024 by Diana Hadassah SQUIBB, MD.  Called Nurse Triage reporting lab results.  Symptoms began n/a.  Interventions attempted: Other: n/a.  Symptoms are: n/a.  Triage Disposition: Information or Advice Only Call  Patient/caregiver understands and will follow disposition?: Yes  Copied from CRM #[id number]. Topic: Clinical - Lab/Test Results >> Mar 28, 2024  4:56 PM Diana Estrada wrote: Reason for CRM: Patient has further questions about lab results Reason for Disposition  Health information question, no triage required and triager able to answer question  Answer Assessment - Initial Assessment Questions 1. REASON FOR CALL: What is the main reason for your call? or How can I best help you?     Obtain lab results  3. OTHER QUESTIONS: Do you have any other questions?  Results given per Dr. Herold:     Please call patient and let her know she has c diff. I sent an antibiotic to take twice daily for 10 days. It looks like she hasn't read the message and ideally should start taking the medicine as soon as possible. Thanks!  Protocols used: Information Only Call - No Triage-A-AH

## 2024-03-28 NOTE — Telephone Encounter (Signed)
 Copied from CRM #[id number]. Topic: Clinical - Prescription Issue >> Mar 28, 2024  1:43 PM Nathanel BROCKS wrote: Reason for CRM:  fidaxomicin  (DIFICID ) 200 MG TABS tablet  Phrmacy is saying that they did not get this rx. Could you please resend for the pt

## 2024-03-29 MED ORDER — FIDAXOMICIN 200 MG PO TABS: 200.0000 mg | ORAL_TABLET | Freq: Two times a day (BID) | ORAL | 0 refills | Status: DC

## 2024-03-29 NOTE — Telephone Encounter (Signed)
 Attempted to contact patient no answer left vm for return call

## 2024-03-30 ENCOUNTER — Telehealth: Payer: Self-pay

## 2024-03-30 NOTE — Telephone Encounter (Signed)
 Copied from CRM #[id number]. Topic: Clinical - Prescription Issue >> Mar 30, 2024 10:04 AM Everette C wrote: Reason for CRM: The patient shares that their prescription is not covered by insurance and they would like to be contacted by a member of clinical staff to discuss potential alternatives for their recently prescribed fidaxomicin  (DIFICID ) 200 MG TABS tablet [[id number]]. Please contact the patient further when possible

## 2024-03-31 ENCOUNTER — Other Ambulatory Visit: Payer: Self-pay | Admitting: Pediatrics

## 2024-03-31 DIAGNOSIS — A0472 Enterocolitis due to Clostridium difficile, not specified as recurrent: Secondary | ICD-10-CM

## 2024-03-31 MED ORDER — VANCOMYCIN HCL 125 MG PO CAPS: 125.0000 mg | ORAL_CAPSULE | Freq: Four times a day (QID) | ORAL | 0 refills | Status: AC

## 2024-03-31 NOTE — Progress Notes (Signed)
 Dificid  not covered by insurance, will send oral vanc as alternative treatment for c diff.  Diana SHAUNNA Nett, MD

## 2024-04-01 NOTE — Telephone Encounter (Signed)
See additional TE.

## 2024-04-01 NOTE — Telephone Encounter (Signed)
 Patient notified
